# Patient Record
Sex: Male | Born: 1995 | Race: Black or African American | Hispanic: No | Marital: Single
Health system: Southern US, Community
[De-identification: ages and names within clinical notes are randomized; demographics above are authoritative.]

## PROBLEM LIST (undated history)

## (undated) DIAGNOSIS — A64 Unspecified sexually transmitted disease: Secondary | ICD-10-CM

## (undated) DIAGNOSIS — J9819 Other pulmonary collapse: Secondary | ICD-10-CM

## (undated) HISTORY — PX: CHEST TUBE INSERTION: SHX231

---
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
## 2014-01-03 ENCOUNTER — Ambulatory Visit: Payer: Medicaid Other | Admitting: Family Medicine

## 2014-05-13 ENCOUNTER — Encounter (HOSPITAL_COMMUNITY): Payer: Self-pay | Admitting: Emergency Medicine

## 2014-05-13 ENCOUNTER — Emergency Department (HOSPITAL_COMMUNITY)
Admission: EM | Admit: 2014-05-13 | Discharge: 2014-05-13 | Disposition: A | Discharge status: Home / Self Care | Payer: Medicaid Other | Attending: Emergency Medicine | Admitting: Emergency Medicine

## 2014-05-13 DIAGNOSIS — J029 Acute pharyngitis, unspecified: Secondary | ICD-10-CM | POA: Diagnosis present

## 2014-05-13 DIAGNOSIS — Z720 Tobacco use: Secondary | ICD-10-CM | POA: Insufficient documentation

## 2014-05-13 DIAGNOSIS — R509 Fever, unspecified: Secondary | ICD-10-CM

## 2014-05-13 LAB — RAPID STREP SCREEN (MED CTR MEBANE ONLY): Streptococcus, Group A Screen (Direct): NEGATIVE

## 2014-05-13 MED ORDER — ACETAMINOPHEN 325 MG PO TABS
650.0000 mg | ORAL_TABLET | Freq: Four times a day (QID) | ORAL | Status: DC | PRN
  Administered 2014-05-13: 650 mg via ORAL
  Filled 2014-05-13: qty 2

## 2014-05-13 MED ORDER — IBUPROFEN 800 MG PO TABS: 800.0000 mg | ORAL_TABLET | Freq: Four times a day (QID) | ORAL | Status: DC | PRN

## 2014-05-13 NOTE — Discharge Instructions (Signed)
Fever, Adult °A fever is a higher than normal body temperature. In an adult, an oral temperature around 98.6° F (37° C) is considered normal. A temperature of 100.4° F (38° C) or higher is generally considered a fever. Mild or moderate fevers generally have no long-term effects and often do not require treatment. Extreme fever (greater than or equal to 106° F or 41.1° C) can cause seizures. The sweating that may occur with repeated or prolonged fever may cause dehydration. Elderly people can develop confusion during a fever. °A measured temperature can vary with: °· Age. °· Time of day. °· Method of measurement (mouth, underarm, rectal, or ear). °The fever is confirmed by taking a temperature with a thermometer. Temperatures can be taken different ways. Some methods are accurate and some are not. °· An oral temperature is used most commonly. Electronic thermometers are fast and accurate. °· An ear temperature will only be accurate if the thermometer is positioned as recommended by the manufacturer. °· A rectal temperature is accurate and done for those adults who have a condition where an oral temperature cannot be taken. °· An underarm (axillary) temperature is not accurate and not recommended. °Fever is a symptom, not a disease.  °CAUSES  °· Infections commonly cause fever. °· Some noninfectious causes for fever include: °¨ Some arthritis conditions. °¨ Some thyroid or adrenal gland conditions. °¨ Some immune system conditions. °¨ Some types of cancer. °¨ A medicine reaction. °¨ High doses of certain street drugs such as methamphetamine. °¨ Dehydration. °¨ Exposure to high outside or room temperatures. °· Occasionally, the source of a fever cannot be determined. This is sometimes called a "fever of unknown origin" (FUO). °· Some situations may lead to a temporary rise in body temperature that may go away on its own. Examples are: °¨ Childbirth. °¨ Surgery. °¨ Intense exercise. °HOME CARE INSTRUCTIONS  °· Take  appropriate medicines for fever. Follow dosing instructions carefully. If you use acetaminophen to reduce the fever, be careful to avoid taking other medicines that also contain acetaminophen. Do not take aspirin for a fever if you are younger than age 19. There is an association with Reye's syndrome. Reye's syndrome is a rare but potentially deadly disease. °· If an infection is present and antibiotics have been prescribed, take them as directed. Finish them even if you start to feel better. °· Rest as needed. °· Maintain an adequate fluid intake. To prevent dehydration during an illness with prolonged or recurrent fever, you may need to drink extra fluid. Drink enough fluids to keep your urine clear or pale yellow. °· Sponging or bathing with room temperature water may help reduce body temperature. Do not use ice water or alcohol sponge baths. °· Dress comfortably, but do not over-bundle. °SEEK MEDICAL CARE IF:  °· You are unable to keep fluids down. °· You develop vomiting or diarrhea. °· You are not feeling at least partly better after 3 days. °· You develop new symptoms or problems. °SEEK IMMEDIATE MEDICAL CARE IF:  °· You have shortness of breath or trouble breathing. °· You develop excessive weakness. °· You are dizzy or you faint. °· You are extremely thirsty or you are making little or no urine. °· You develop new pain that was not there before (such as in the head, neck, chest, back, or abdomen). °· You have persistent vomiting and diarrhea for more than 1 to 2 days. °· You develop a stiff neck or your eyes become sensitive to light. °· You develop a   skin rash.  You have a fever or persistent symptoms for more than 2 to 3 days.  You have a fever and your symptoms suddenly get worse. MAKE SURE YOU:   Understand these instructions.  Will watch your condition.  Will get help right away if you are not doing well or get worse. Document Released: 08/11/2000 Document Revised: 07/02/2013 Document  Reviewed: 12/17/2010 [facility] Patient Information 2015 [place]. This information is not intended to replace advice given to you by your health care provider. Make sure you discuss any questions you have with your health care provider. Your strep test is negative.  Can take alternating doses of Tylenol and ibuprofen every 3-4 hours to control fever.  Make sure you get plenty of rest, drink plenty of fluids.  Return if he develops new or worsening symptoms

## 2014-05-13 NOTE — ED Notes (Signed)
Pt states he has had a sore throat since he woke up today. Febrile. Alert and oriented.

## 2014-05-13 NOTE — ED Provider Notes (Signed)
CSN: [id number]     Arrival date & time 05/13/14  1946 History  This chart was scribed for Daniel Higgins Daniel Creed, NP working with Linwood DibblesJon Knapp, MD by Daniel Risingimelie Higgins, ED Scribe. This patient was seen in room WTR8/WTR8 and Daniel patient's care was started at 8:49 PM.   Chief Complaint  Patient presents with  . Sore Throat   Daniel history is provided by Daniel patient. No language interpreter was used.   HPI Comments: [name] is a 19 y.o. male who presents to [facility] Emergency Department complaining of sore throat and fever since awakening this morning. Patient denies additional complaints. Patient has not taken any medication. Patient denies recent sick contacts.   History reviewed. No pertinent past medical history. History reviewed. No pertinent past surgical history. History reviewed. No pertinent family history. History  Substance Use Topics  . Smoking status: Current Some Day Smoker  . Smokeless tobacco: Not on file  . Alcohol Use: No    Review of Systems  Constitutional: Positive for fever.  HENT: Positive for sore throat. Negative for rhinorrhea and trouble swallowing.   Respiratory: Negative for cough and shortness of breath.   Gastrointestinal: Negative for abdominal pain.  Musculoskeletal: Negative for myalgias.      Allergies  Review of patient's allergies indicates no known allergies.  Home Medications   Prior to Admission medications   Medication Sig Start Date End Date Taking? Authorizing Provider  ibuprofen (ADVIL,MOTRIN) 800 MG tablet Take 1 tablet (800 mg total) by mouth every 6 (six) hours as needed. 05/13/14   Daniel Higgins Toniann Dickerson, NP   Triage Vitals: BP 102/75 mmHg  Pulse 117  Temp(Src) 103.1 F (39.5 C) (Oral)  SpO2 97% Physical Exam  Constitutional: He is oriented to person, place, and time. He appears well-developed and well-nourished. No distress.  Patient appears ill  HENT:  Head: Normocephalic and atraumatic.  Right Ear: External ear normal.  Left Ear: External ear  normal.  Eyes: Pupils are equal, round, and reactive to light.  Neck: Normal range of motion.  Cardiovascular: Normal rate and regular rhythm.   Pulmonary/Chest: Effort normal and breath sounds normal.  Abdominal: Soft. He exhibits no distension. There is no tenderness.  Musculoskeletal: Normal range of motion.  Neurological: He is alert and oriented to person, place, and time.  Skin: Skin is warm. No rash noted. No erythema.  Nursing note and vitals reviewed.   ED Course  Procedures (including critical care time)  COORDINATION OF CARE: 9:22 PM- Discussed treatment plan with patient at bedside and patient agreed to plan.   Labs Review Labs Reviewed  RAPID STREP SCREEN  CULTURE, GROUP A STREP    Imaging Review No results found.   EKG Interpretation None     Patient's strep test is negative.  He's been instructed to take alternating doses of Tylenol or ibuprofen for any fever over 100.5, reported fluid, get plenty of rest.  Return if he develops new or worsening symptoms MDM   Final diagnoses:  Pharyngitis  Fever, unspecified fever cause       I personally performed Daniel services described in this documentation, which was scribed in my presence. Daniel recorded information has been reviewed and is accurate.   Daniel Higgins Kassia Demarinis, NP 05/13/14 [id number]  Eber HongBrian Miller, MD 05/14/14 1146

## 2014-05-16 LAB — CULTURE, GROUP A STREP: Strep A Culture: POSITIVE — AB

## 2014-05-17 ENCOUNTER — Telehealth (HOSPITAL_COMMUNITY): Payer: Self-pay

## 2014-05-17 ENCOUNTER — Telehealth (HOSPITAL_BASED_OUTPATIENT_CLINIC_OR_DEPARTMENT_OTHER): Payer: Self-pay | Admitting: Emergency Medicine

## 2014-05-17 NOTE — Progress Notes (Signed)
ED Antimicrobial Stewardship Positive Culture Follow Up   Daniel MentionShamar R Shawnie Higgins is an 19 y.o. male [facility] with a chief complaint of  Chief Complaint  Patient presents with  . Sore Throat    Recent Results (from the past 720 hour(s))  Rapid strep screen   (If patient has fever and/or without cough or runny nose)     Status: None   Collection Time: 05/13/14  7:37 PM  Result Value Ref Range Status   Streptococcus, Group A Screen (Direct) NEGATIVE NEGATIVE Final    Comment: (NOTE) A Rapid Antigen test may result negative if the antigen level in the sample is below the detection level of this test. The FDA has not cleared this test as a stand-alone test therefore the rapid antigen negative result has reflexed to a Group A Strep culture.   Culture, Group A Strep     Status: Abnormal   Collection Time: 05/13/14  7:37 PM  Result Value Ref Range Status   Strep A Culture Positive (A)  Final    Comment: (NOTE) Penicillin and ampicillin are drugs of choice for treatment of beta-hemolytic streptococcal infections. Susceptibility testing of penicillins and other beta-lactam agents approved by the FDA for treatment of beta-hemolytic streptococcal infections need not be performed routinely because nonsusceptible isolates are extremely rare in any beta-hemolytic streptococcus and have not been reported for Streptococcus pyogenes (group A). (CLSI 2011) Performed At: [facility] [address] [place], [place] [id number] Mila HomerHancock William F MD VV:[id number]:[phone]     []  Treated with , organism resistant to prescribed antimicrobial [x]  Patient discharged originally without antimicrobial agent and treatment is now indicated   19 yo who presented with sore throat. Now positive for strep.  New antibiotic prescription:   Amoxicillin 500mg  PO BID x10days  ED Provider: Teressa LowerVrinda Pickering, NP  Ulyses SouthwardMinh Elaijah Munoz, PharmD Pager: [phone] Infectious Diseases  Pharmacist Phone# [phone]

## 2014-05-17 NOTE — Telephone Encounter (Signed)
Pt called back w/name of Pharmacy.  Rx called in and given to RPh @ [facility] [phone].

## 2014-12-01 ENCOUNTER — Emergency Department (HOSPITAL_COMMUNITY): Payer: Medicaid Other

## 2014-12-01 ENCOUNTER — Emergency Department (HOSPITAL_COMMUNITY)
Admission: EM | Admit: 2014-12-01 | Discharge: 2014-12-02 | Disposition: A | Discharge status: Home / Self Care | Payer: Medicaid Other | Attending: Emergency Medicine | Admitting: Emergency Medicine

## 2014-12-01 ENCOUNTER — Encounter (HOSPITAL_COMMUNITY): Payer: Self-pay | Admitting: Emergency Medicine

## 2014-12-01 DIAGNOSIS — R079 Chest pain, unspecified: Secondary | ICD-10-CM | POA: Diagnosis not present

## 2014-12-01 DIAGNOSIS — R059 Cough, unspecified: Secondary | ICD-10-CM

## 2014-12-01 DIAGNOSIS — J029 Acute pharyngitis, unspecified: Secondary | ICD-10-CM | POA: Diagnosis not present

## 2014-12-01 DIAGNOSIS — R0981 Nasal congestion: Secondary | ICD-10-CM | POA: Insufficient documentation

## 2014-12-01 DIAGNOSIS — R05 Cough: Secondary | ICD-10-CM | POA: Diagnosis not present

## 2014-12-01 DIAGNOSIS — Z720 Tobacco use: Secondary | ICD-10-CM | POA: Insufficient documentation

## 2014-12-01 MED ORDER — BENZONATATE 100 MG PO CAPS
200.0000 mg | ORAL_CAPSULE | Freq: Once | ORAL | Status: AC
  Administered 2014-12-02: 200 mg via ORAL
  Filled 2014-12-01: qty 2

## 2014-12-01 MED ORDER — KETOROLAC TROMETHAMINE 60 MG/2ML IM SOLN
60.0000 mg | Freq: Once | INTRAMUSCULAR | Status: AC
  Administered 2014-12-02: 60 mg via INTRAMUSCULAR
  Filled 2014-12-01: qty 2

## 2014-12-01 NOTE — ED Provider Notes (Signed)
CSN: [id number]   Arrival date & time 12/01/14 2227  History  By signing my name below, I, Bethel Born, attest that this documentation has been prepared under the direction and in the presence of Skyylar Kopf, MD. Electronically Signed: Bethel Born, ED Scribe. 12/02/2014. 12:06 AM.  No chief complaint on file.   HPI Patient is a 19 y.o. male presenting with chest pain. The history is provided by the patient. No language interpreter was used.  Chest Pain Chest pain location: Diffuse. Pain radiates to:  Does not radiate Pain radiates to the back: no   Pain severity:  Moderate Onset quality:  Gradual Duration:  1 week Timing:  Sporadic Progression:  Unchanged Chronicity:  New Context comment:  Coughing Relieved by:  Nothing Worsened by:  Coughing Ineffective treatments: OTC medication. Associated symptoms: cough   Associated symptoms: no abdominal pain, no anorexia, no back pain, no diaphoresis, no dizziness, no fever, no headache, no lower extremity edema, no nausea, no numbness, no palpitations, no shortness of breath, not vomiting and no weakness   Cough:    Cough characteristics:  Non-productive   Severity:  Moderate   Onset quality:  Gradual   Timing:  Sporadic   Progression:  Unchanged   Chronicity:  New Risk factors: smoking   Risk factors: no diabetes mellitus    Daniel Higgins is a 19 y.o. male who presents to the Emergency Department complaining of constant and diffuse chest pain elicited with a dry cough with onset 1 week ago. OTC cold medication provided insufficient relief in symptoms PTA.  Associated symptoms include sore throat. Pt denies fever, abdominal pain,vomiting, and diarrhea. Tobacco+.   History reviewed. No pertinent past medical history.  History reviewed. No pertinent past surgical history.  History reviewed. No pertinent family history.  Social History  Substance Use Topics  . Smoking status: Current Some Day Smoker  . Smokeless tobacco:  None  . Alcohol Use: No     Review of Systems  Constitutional: Negative for fever and diaphoresis.  HENT: Positive for congestion and sore throat.   Respiratory: Positive for cough. Negative for chest tightness and shortness of breath.   Cardiovascular: Positive for chest pain. Negative for palpitations and leg swelling.  Gastrointestinal: Negative for nausea, vomiting, abdominal pain and anorexia.  Musculoskeletal: Negative for back pain.  Neurological: Negative for dizziness, weakness, numbness and headaches.  All other systems reviewed and are negative.  Home Medications   Prior to Admission medications   Medication Sig Start Date End Date Taking? Authorizing Provider  ibuprofen (ADVIL,MOTRIN) 800 MG tablet Take 1 tablet (800 mg total) by mouth every 6 (six) hours as needed. 05/13/14   Earley Favor, NP    Allergies  Review of patient's allergies indicates no known allergies.  Triage Vitals: BP 106/57 mmHg  Pulse 94  Temp(Src) 98.3 F (36.8 C) (Oral)  Resp 18  SpO2 99%  Physical Exam  Constitutional: He is oriented to person, place, and time. He appears well-developed and well-nourished. No distress.  HENT:  Head: Normocephalic and atraumatic.  Mouth/Throat: Oropharynx is clear and moist. No oropharyngeal exudate.  Moist mucous membranes  Eyes: Conjunctivae and EOM are normal. Pupils are equal, round, and reactive to light.  Neck: Normal range of motion. Neck supple. No tracheal deviation present.  Trachea midline  Cardiovascular: Normal rate and regular rhythm.  Exam reveals no gallop and no friction rub.   No murmur heard. Pulmonary/Chest: Effort normal and breath sounds normal. No stridor. No respiratory distress.  He has no wheezes. He has no rales. He exhibits no tenderness.  Abdominal: Soft. Bowel sounds are normal. There is no tenderness. There is no rebound and no guarding.  Musculoskeletal: Normal range of motion. He exhibits no edema or tenderness.  Neurological:  He is alert and oriented to person, place, and time.  Skin: Skin is warm and dry.  Psychiatric: He has a normal mood and affect. His behavior is normal.  Nursing note and vitals reviewed.   ED Course  Procedures   DIAGNOSTIC STUDIES: Oxygen Saturation is 99% on RA, normal by my interpretation.    COORDINATION OF CARE: 11:27 PM Discussed treatment plan which includes CXR, EKG, and pain management with pt at bedside and pt agreed to plan.  Labs Reviewed - No data to display  Imaging Review No results found.   I personally reviewed and evaluated these images and lab results as a part of my medical decision-making.   EKG Interpretation  Date/Time:  Sunday December 01 2014 23:35:06 EDT Ventricular Rate:  84 PR Interval:  148 QRS Duration: 82 QT Interval:  333 QTC Calculation: 394 R Axis:   84 Text Interpretation:  Sinus rhythm Confirmed by [facility]  MD, Yamilee Harmes ([id number]) on 12/01/2014 11:39:58 PM    MDM   Final diagnoses:  None  No coughing in the ED. PERC negative wells 0 highly doubt PE.  Likely allergic.  Will start zyrtec follow up with your PMD   I, Kinisha Soper, MD, personally performed the services described in this documentation. All medical record entries made by the scribe were at my direction and in my presence.  I have reviewed the chart and discharge instructions and agree that the record reflects my personal performance and is accurate and complete. Arlow Spiers, MD.  12/02/2014. 12:10 AM.      Jezabelle Chisolm, MD 12/02/14 5621

## 2014-12-01 NOTE — ED Notes (Signed)
Pt here from home. States "when he coughs his heart and ribs hurt". Pt states he has had a cough x 1 week. Pt is a smoker

## 2014-12-02 ENCOUNTER — Encounter (HOSPITAL_COMMUNITY): Payer: Self-pay | Admitting: Emergency Medicine

## 2014-12-02 MED ORDER — CETIRIZINE HCL 10 MG PO CAPS: 1.0000 | ORAL_CAPSULE | Freq: Every morning | ORAL | Status: DC

## 2014-12-02 NOTE — Discharge Instructions (Signed)
Cool Mist Vaporizers °Vaporizers may help relieve the symptoms of a cough and cold. They add moisture to the air, which helps mucus to become thinner and less sticky. This makes it easier to breathe and cough up secretions. Cool mist vaporizers do not cause serious burns like hot mist vaporizers, which may also be called steamers or humidifiers. Vaporizers have not been proven to help with colds. You should not use a vaporizer if you are allergic to mold. °HOME CARE INSTRUCTIONS °· Follow the package instructions for the vaporizer. °· Do not use anything other than distilled water in the vaporizer. °· Do not run the vaporizer all of the time. This can cause mold or bacteria to grow in the vaporizer. °· Clean the vaporizer after each time it is used. °· Clean and dry the vaporizer well before storing it. °· Stop using the vaporizer if worsening respiratory symptoms develop. °Document Released: 11/13/2003 Document Revised: 02/20/2013 Document Reviewed: 07/05/2012 °ExitCare® Patient Information ©2015 [employer]. This information is not intended to replace advice given to you by your health care provider. Make sure you discuss any questions you have with your health care provider. ° °

## 2014-12-17 ENCOUNTER — Encounter (HOSPITAL_COMMUNITY): Payer: Self-pay | Admitting: Emergency Medicine

## 2014-12-17 ENCOUNTER — Emergency Department (HOSPITAL_COMMUNITY)
Admission: EM | Admit: 2014-12-17 | Discharge: 2014-12-17 | Disposition: A | Discharge status: Home / Self Care | Payer: Medicaid Other | Attending: Emergency Medicine | Admitting: Emergency Medicine

## 2014-12-17 DIAGNOSIS — Z720 Tobacco use: Secondary | ICD-10-CM | POA: Diagnosis not present

## 2014-12-17 DIAGNOSIS — Z202 Contact with and (suspected) exposure to infections with a predominantly sexual mode of transmission: Secondary | ICD-10-CM | POA: Insufficient documentation

## 2014-12-17 DIAGNOSIS — Z113 Encounter for screening for infections with a predominantly sexual mode of transmission: Secondary | ICD-10-CM

## 2014-12-17 MED ORDER — AZITHROMYCIN 250 MG PO TABS
1000.0000 mg | ORAL_TABLET | Freq: Once | ORAL | Status: DC
  Filled 2014-12-17: qty 4

## 2014-12-17 MED ORDER — CEFTRIAXONE SODIUM 250 MG IJ SOLR: 250.0000 mg | Freq: Once | INTRAMUSCULAR | Status: DC

## 2014-12-17 NOTE — ED Provider Notes (Signed)
CSN: [id number]     Arrival date & time 12/17/14  1742 History  By signing my name below, I, Freida Busman, attest that this documentation has been prepared under the direction and in the presence of non-physician practitioner, Arthor Captain, PA-C. Electronically Signed: Freida Busman, Scribe. 12/17/2014. 6:03 PM.   Chief Complaint  Patient presents with  . Exposure to STD   The history is provided by the patient. No language interpreter was used.    HPI Comments:  Daniel Higgins is a 19 y.o. male who presents to the Emergency Department for STD testing. He denies known STD exposure; states he just wanted to get checked out. Pt is asymptomatic at this time; no fever, penile discharge/drainage,rash or dysuria.  History reviewed. No pertinent past medical history. History reviewed. No pertinent past surgical history. No family history on file. Social History  Substance Use Topics  . Smoking status: Current Some Day Smoker    Types: Cigars  . Smokeless tobacco: None  . Alcohol Use: No    Review of Systems  Constitutional: Negative for fever and chills.  Respiratory: Negative for shortness of breath.   Cardiovascular: Negative for chest pain.  Genitourinary: Negative for dysuria, discharge, penile swelling, scrotal swelling, genital sores, penile pain and testicular pain.  Skin: Negative for rash.   Allergies  Review of patient's allergies indicates no known allergies.  Home Medications   Prior to Admission medications   Medication Sig Start Date End Date Taking? Authorizing Provider  Cetirizine HCl (ZYRTEC ALLERGY) 10 MG CAPS Take 1 capsule (10 mg total) by mouth every morning. 12/02/14   April Palumbo, MD  ibuprofen (ADVIL,MOTRIN) 800 MG tablet Take 1 tablet (800 mg total) by mouth every 6 (six) hours as needed. Patient not taking: Reported on 12/01/2014 05/13/14   Earley Favor, NP   BP 110/68 mmHg  Pulse 99  Temp(Src) 98.4 F (36.9 C) (Oral)  Resp 16  Ht 6' (1.829 m)   SpO2 98% Physical Exam  Constitutional: He is oriented to person, place, and time. He appears well-developed and well-nourished. No distress.  HENT:  Head: Normocephalic and atraumatic.  Eyes: Conjunctivae are normal.  Cardiovascular: Normal rate.   Pulmonary/Chest: Effort normal.  Abdominal: He exhibits no distension.  Genitourinary:  Chaperone (ED Tech) was present for exam which was performed with no discomfort or complications.    Neurological: He is alert and oriented to person, place, and time.  Skin: Skin is warm and dry.  Psychiatric: He has a normal mood and affect.  Nursing note and vitals reviewed.   ED Course  Procedures   DIAGNOSTIC STUDIES:  Oxygen Saturation is 98% on RA, normal by my interpretation.    COORDINATION OF CARE:  6:10 PM Will order lab tests. Discussed treatment plan with pt at bedside and pt agreed to plan.  Labs Review Labs Reviewed  HIV ANTIBODY (ROUTINE TESTING)  RPR  GC/CHLAMYDIA PROBE AMP ([place]) NOT AT [facility]    Imaging Review No results found. I have personally reviewed and evaluated these lab results as part of my medical decision-making.   EKG Interpretation None      MDM   Final diagnoses:  None    Patient without sxs or exposure. Cultures/hiv/rpr taken  Patient discharged with f/u instructions. Referral to health department for routine STD testinng \  I, Arthor Captain, personally performed the services described in this documentation. All medical record entries made by the scribe were at my direction and in my presence.  I have reviewed the chart and discharge instructions and agree that the record reflects my personal performance and is accurate and complete. Arthor CaptainHarris, Alontae Chaloux.  12/17/2014. 8:07 PM.       Arthor CaptainAbigail Tashon Capp, PA-C 12/17/14 2008  Donnetta HutchingBrian Cook, MD 12/17/14 2239

## 2014-12-17 NOTE — Discharge Instructions (Signed)
Sexually Transmitted Disease °A sexually transmitted disease (STD) is a disease or infection that may be passed (transmitted) from person to person, usually during sexual activity. This may happen by way of saliva, semen, blood, vaginal mucus, or urine. Common STDs include: °· Gonorrhea. °· Chlamydia. °· Syphilis. °· HIV and AIDS. °· Genital herpes. °· Hepatitis B and C. °· Trichomonas. °· Human papillomavirus (HPV). °· Pubic lice. °· Scabies. °· Mites. °· Bacterial vaginosis. °WHAT ARE CAUSES OF STDs? °An STD may be caused by bacteria, a virus, or parasites. STDs are often transmitted during sexual activity if one person is infected. However, they may also be transmitted through nonsexual means. STDs may be transmitted after:  °· Sexual intercourse with an infected person. °· Sharing sex toys with an infected person. °· Sharing needles with an infected person or using unclean piercing or tattoo needles. °· Having intimate contact with the genitals, mouth, or rectal areas of an infected person. °· Exposure to infected fluids during birth. °WHAT ARE THE SIGNS AND SYMPTOMS OF STDs? °Different STDs have different symptoms. Some people may not have any symptoms. If symptoms are present, they may include: °· Painful or bloody urination. °· Pain in the pelvis, abdomen, vagina, anus, throat, or eyes. °· A skin rash, itching, or irritation. °· Growths, ulcerations, blisters, or sores in the genital and anal areas. °· Abnormal vaginal discharge with or without bad odor. °· Penile discharge in men. °· Fever. °· Pain or bleeding during sexual intercourse. °· Swollen glands in the groin area. °· Yellow skin and eyes (jaundice). This is seen with hepatitis. °· Swollen testicles. °· Infertility. °· Sores and blisters in the mouth. °HOW ARE STDs DIAGNOSED? °To make a diagnosis, your health care provider may: °· Take a medical history. °· Perform a physical exam. °· Take a sample of any discharge to examine. °· Swab the throat,  cervix, opening to the penis, rectum, or vagina for testing. °· Test a sample of your first morning urine. °· Perform blood tests. °· Perform a Pap test, if this applies. °· Perform a colposcopy. °· Perform a laparoscopy. °HOW ARE STDs TREATED? °Treatment depends on the STD. Some STDs may be treated but not cured. °· Chlamydia, gonorrhea, trichomonas, and syphilis can be cured with antibiotic medicine. °· Genital herpes, hepatitis, and HIV can be treated, but not cured, with prescribed medicines. The medicines lessen symptoms. °· Genital warts from HPV can be treated with medicine or by freezing, burning (electrocautery), or surgery. Warts may come back. °· HPV cannot be cured with medicine or surgery. However, abnormal areas may be removed from the cervix, vagina, or vulva. °· If your diagnosis is confirmed, your recent sexual partners need treatment. This is true even if they are symptom-free or have a negative culture or evaluation. They should not have sex until their health care providers say it is okay. °· Your health care provider may test you for infection again 3 months after treatment. °HOW CAN I REDUCE MY RISK OF GETTING AN STD? °Take these steps to reduce your risk of getting an STD: °· Use latex condoms, dental dams, and water-soluble lubricants during sexual activity. Do not use petroleum jelly or oils. °· Avoid having multiple sex partners. °· Do not have sex with someone who has other sex partners °· Do not have sex with anyone you do not know or who is at high risk for an STD. °· Avoid risky sex practices that can break your skin. °· Do not have sex   sex if you have open sores on your mouth or skin.  Avoid drinking too much alcohol or taking illegal drugs. Alcohol and drugs can affect your judgment and put you in a vulnerable position.  Avoid engaging in oral and anal sex acts.  Get vaccinated for HPV and hepatitis. If you have not received these vaccines in the past, talk to your health care  provider about whether one or both might be right for you.  If you are at risk of being infected with HIV, it is recommended that you take a prescription medicine daily to prevent HIV infection. This is called pre-exposure prophylaxis (PrEP). You are considered at risk if:  You are a man who has sex with other men (MSM).  You are a heterosexual man or woman and are sexually active with more than one partner.  You take drugs by injection.  You are sexually active with a partner who has HIV.  Talk with your health care provider about whether you are at high risk of being infected with HIV. If you choose to begin PrEP, you should first be tested for HIV. You should then be tested every 3 months for as long as you are taking PrEP. WHAT SHOULD I DO IF I THINK I HAVE AN STD?  See your health care provider.  Tell your sexual partner(s). They should be tested and treated for any STDs.  Do not have sex until your health care provider says it is okay. WHEN SHOULD I GET IMMEDIATE MEDICAL CARE? Contact your health care provider right away if:   You have severe abdominal pain.  You are a man and notice swelling or pain in your testicles.  You are a woman and notice swelling or pain in your vagina.   This information is not intended to replace advice given to you by your health care provider. Make sure you discuss any questions you have with your health care provider.   Document Released: 05/08/2002 Document Revised: 03/08/2014 Document Reviewed: 09/05/2012 Elsevier Interactive Patient Education 2016 Elsevier Inc.  Free HIV and STD Testing These locations offer FREE confidential testing for HIV, Chlamydia, Gonorrhea, and Syphilis. Non-Traditional Testing Sites Address Telephone  [facility] [address], [place] [phone] Mondays 5pm - 7pm  [facility] [address] [place] [phone] Wednesdays  2pm-8pm  [facility] [address], [place] [phone] Thursdays 9am-12noon 1pm-4pm  [facility] [address], [place] [phone] Tuesdays Thursdays 9am-12noon 1pm-4pm  [facility] offers free, confidential testing and treatment for HIV, Chlamydia, Gonorrhea, Syphilis, Herpes, Bacterial Vaginosis, Yeast, and Trichomoniasis. Traditional Testing   [facility] [address], [place] [phone]  Monday thru Friday  Call for an appointment  [facility] [address] [place] [phone] Monday thru Friday  Call for anappointment.  If you have any questions about this information please call [phone]. 01/07/2011

## 2014-12-17 NOTE — ED Notes (Signed)
Pt from home for std check, denies any symptoms at this time, states "my mamma told me to come get checked out." pt denies any pain.

## 2014-12-18 LAB — RPR: RPR Ser Ql: NONREACTIVE

## 2014-12-18 LAB — GC/CHLAMYDIA PROBE AMP (~~LOC~~) NOT AT ARMC
Chlamydia: NEGATIVE
NEISSERIA GONORRHEA: NEGATIVE

## 2014-12-18 LAB — HIV ANTIBODY (ROUTINE TESTING W REFLEX): HIV SCREEN 4TH GENERATION: NONREACTIVE

## 2015-08-13 ENCOUNTER — Emergency Department
Admission: EM | Admit: 2015-08-13 | Discharge: 2015-08-13 | Disposition: A | Discharge status: Home / Self Care | Payer: Self-pay | Attending: Emergency Medicine | Admitting: Emergency Medicine

## 2015-08-13 ENCOUNTER — Emergency Department: Payer: Self-pay

## 2015-08-13 ENCOUNTER — Encounter: Payer: Self-pay | Admitting: *Deleted

## 2015-08-13 DIAGNOSIS — T50905A Adverse effect of unspecified drugs, medicaments and biological substances, initial encounter: Secondary | ICD-10-CM

## 2015-08-13 DIAGNOSIS — R079 Chest pain, unspecified: Secondary | ICD-10-CM | POA: Insufficient documentation

## 2015-08-13 DIAGNOSIS — T380X5A Adverse effect of glucocorticoids and synthetic analogues, initial encounter: Secondary | ICD-10-CM | POA: Insufficient documentation

## 2015-08-13 DIAGNOSIS — Z79899 Other long term (current) drug therapy: Secondary | ICD-10-CM | POA: Insufficient documentation

## 2015-08-13 DIAGNOSIS — F17210 Nicotine dependence, cigarettes, uncomplicated: Secondary | ICD-10-CM | POA: Insufficient documentation

## 2015-08-13 LAB — BASIC METABOLIC PANEL
Anion gap: 8 (ref 5–15)
BUN: 7 mg/dL (ref 6–20)
CHLORIDE: 103 mmol/L (ref 101–111)
CO2: 28 mmol/L (ref 22–32)
Calcium: 9.5 mg/dL (ref 8.9–10.3)
Creatinine, Ser: 0.72 mg/dL (ref 0.61–1.24)
GFR calc non Af Amer: >60 mL/min (ref >60)
Glucose, Bld: 87 mg/dL (ref 65–99)
POTASSIUM: 3.5 mmol/L (ref 3.5–5.1)
SODIUM: 139 mmol/L (ref 135–145)

## 2015-08-13 LAB — CBC
HEMATOCRIT: 41.3 % (ref 40.0–52.0)
Hemoglobin: 13.6 g/dL (ref 13.0–18.0)
MCH: 28.1 pg (ref 26.0–34.0)
MCHC: 32.9 g/dL (ref 32.0–36.0)
MCV: 85.2 fL (ref 80.0–100.0)
Platelets: 182 10*3/uL (ref 150–440)
RBC: 4.85 MIL/uL (ref 4.40–5.90)
RDW: 13.9 % (ref 11.5–14.5)
WBC: 7.8 10*3/uL (ref 3.8–10.6)

## 2015-08-13 LAB — TROPONIN I: Troponin I: <0.03 ng/mL (ref <0.031)

## 2015-08-13 NOTE — ED Notes (Signed)
Pt reports he had a headache yesterday and took a pill called d.bal.max a steroid.   Pt started having chest pain and dizziness since taking it. Pt continues to have a headache.  Pt alert.  Speech clear.

## 2015-08-13 NOTE — Discharge Instructions (Signed)
Drug Toxicity °Drug toxicity refers to harmful and unwanted (adverse) effects of a drug in your body. Drug toxicity often results from taking too much of a drug (overdose) by accident or on purpose. With some drugs, there is only a small difference between the dose that is needed to treat your condition and a dose that is harmful (narrow therapeutic range). However, any drug can be toxic at high doses, and even normal doses of certain drugs can be toxic for some people. These include over-the-counter (OTC) medicines. °Drug toxicity can happen suddenly when you first start taking a drug or when you suddenly take too much of a drug (acute toxicity). It can also happen as a result of taking a drug for a long period of time (chronic toxicity). The effects of drug toxicity can be mild, dangerous, or even deadly. °CAUSES °Many things can cause drug toxicity. Common causes of acute toxicity include a drug overdose or an allergic reaction to a drug. °Most drugs are broken down (metabolized) by your liver and eliminated (excreted) by your kidneys. Chronic drug toxicity can result from changes in the way that your body metabolizes a drug. This can happen, for example, if you weigh less than you did when you started taking a drug but you keep taking the same dose that you took at the heavier weight. °RISK FACTORS °You may have a higher risk for drug toxicity if you: °· Are under 18 years of age or over 65 years of age. °· Have liver disease, kidney disease, or another medical condition. °· Are taking more than one drug. °· Are pregnant. °· Are allergic to certain drugs. °· Have genes that cause you to be more affected by (susceptible to) certain drugs. °· Take a drug that has a narrow therapeutic range. °Certain types of drugs are more likely than others to cause toxicity. Many drugs have a narrow therapeutic range, including: °· Blood thinners. °· Heart medicines. °· Diabetes medicines. °· Medicines to prevent or stop  seizures. °· Theophylline for asthma. °· Lithium for bipolar disorder. °SYMPTOMS °Signs and symptoms of drug toxicity depend on the drug and the amount that was taken. They may start suddenly or develop gradually over time. °DIAGNOSIS °Drug toxicity may be diagnosed based on your symptoms. Some drugs have known side effects that suggest toxicity. It is important that you tell health care provider about all of the drugs that you are taking and whether you have ever had a reaction to a drug. °Your health care provider will do a physical exam. You may have tests to check for drug toxicity, including: °· Blood tests to measure the amount of the drug in your blood or to check for signs of kidney or liver damage. °· Urine tests. °· Other tests to check for organ damage. °TREATMENT °Treatment may include: °· Stopping the drug. °· Lowering the dose of the drug. °· Switching to a different drug. °You may also need treatment to stop or reverse the effects of the toxicity. These treatments depend on the drug that caused the toxicity, how severe the toxicity is, and which parts of your body are affected. °HOME CARE INSTRUCTIONS °· Take medicines only as directed by your health care provider. Always ask your health care provider to discuss the possible side effects of any new drug that you start taking. °· Keep a list of all of the drugs that you take, including over-the-counter medicines. Bring this list with you to all of your medical visits. °· Read   the drug inserts that come with your medicines. °· Keep all follow-up visits as directed by your health care provider. This is important. °SEEK MEDICAL CARE IF: °· Your symptoms return. °· You develop any new signs or symptoms when you are taking medicines. °· You notice any signs that indicate that you are taking too much of your medicine, based on what your health care provider told you to watch for. °SEEK IMMEDIATE MEDICAL CARE IF: °· You have chest pain. °· You have difficulty  breathing. °· You have a loss of consciousness. °  °This information is not intended to replace advice given to you by your health care provider. Make sure you discuss any questions you have with your health care provider. °  °Document Released: 02/15/2005 Document Revised: 07/02/2014 Document Reviewed: 02/20/2014 °Elsevier Interactive Patient Education ©2016 Elsevier Inc. ° °

## 2015-08-13 NOTE — ED Provider Notes (Signed)
[facility] Emergency Department Provider Note  ____________________________________________  Time seen: Approximately 3:37 PM  I have reviewed the triage vital signs and the nursing notes.   HISTORY  Chief Complaint Chest Pain    HPI Daniel Higgins is a 20 y.o. male reports no previous medical history.  Patient tells me that he is at the beach, he mild headache yesterday and was given a pill called D.Bal.Max from a friend to help with the headache. He took it, and his headache is gone away but he reports that he's felt shaky, and lightheaded. He looked up side effects of this medication online, and saw that it can cause problems. His symptoms he reports are better now, but he was having some feeling of lightheadedness and an uneasy feeling across his chest yesterday. This is completely resolved now.  Chest pain was not described as a heaviness or pressure, but rather hard to describe uneasiness. He has no symptoms or discomfort now. No nausea or vomiting. The pain was not radiating. His headache is completely gone away. He reports all symptoms seem to be better. He is once no visual way to "get the medication out".   No past medical history on file.  There are no active problems to display for this patient.   No past surgical history on file.  Current Outpatient Rx  Name  Route  Sig  Dispense  Refill  . Cetirizine HCl (ZYRTEC ALLERGY) 10 MG CAPS   Oral   Take 1 capsule (10 mg total) by mouth every morning.   30 capsule   0   . ibuprofen (ADVIL,MOTRIN) 800 MG tablet   Oral   Take 1 tablet (800 mg total) by mouth every 6 (six) hours as needed. Patient not taking: Reported on 12/01/2014   30 tablet   0     Allergies Review of patient's allergies indicates no known allergies.  No family history on file.  Social History Social History  Substance Use Topics  . Smoking status: Current Some Day Smoker    Types: Cigars  . Smokeless tobacco: None   . Alcohol Use: No    Review of Systems Constitutional: No fever/chills Eyes: No visual changes. ENT: No sore throat. Cardiovascular: See history of present illness Respiratory: Denies shortness of breath. Gastrointestinal: No abdominal pain.  No nausea, no vomiting.  No diarrhea.  No constipation. Genitourinary: Negative for dysuria. Musculoskeletal: Negative for back pain. Skin: Negative for rash. Neurological: Negative for  focal weakness or numbness. Did feel kind of "lightheaded" yesterday evening and this morning, now resolved.  10-point ROS otherwise negative.  ____________________________________________   PHYSICAL EXAM:  VITAL SIGNS: ED Triage Vitals  Enc Vitals Group     BP 08/13/15 1516 108/63 mmHg     Pulse Rate 08/13/15 1516 76     Resp 08/13/15 1516 18     Temp 08/13/15 1516 98.4 F (36.9 C)     Temp Source 08/13/15 1516 Oral     SpO2 08/13/15 1516 100 %     Weight 08/13/15 1516 120 lb (54.432 kg)     Height 08/13/15 1516 5\' 9"  (1.753 m)     Head Cir --      Peak Flow --      Pain Score 08/13/15 1517 8     Pain Loc --      Pain Edu? --      Excl. in GC? --    Constitutional: Alert and oriented. Well appearing and in  no acute distress. Eyes: Conjunctivae are normal. PERRL. EOMI. Head: Atraumatic. Nose: No congestion/rhinnorhea. Mouth/Throat: Mucous membranes are moist.  Oropharynx non-erythematous. Neck: No stridor.   Cardiovascular: Normal rate, regular rhythm. Grossly normal heart sounds.  Good peripheral circulation. Respiratory: Normal respiratory effort.  No retractions. Lungs CTAB. Gastrointestinal: Soft and nontender. No distention. No abdominal bruits. No CVA tenderness. Musculoskeletal: No lower extremity tenderness nor edema.  No joint effusions. Neurologic:  Normal speech and language. No gross focal neurologic deficits are appreciated. No gait instability. Skin:  Skin is warm, dry and intact. No rash noted. Psychiatric: Mood and affect are  normal. Speech and behavior are normal.  ____________________________________________   LABS (all labs ordered are listed, but only abnormal results are displayed)  Labs Reviewed  BASIC METABOLIC PANEL  CBC  TROPONIN I   ____________________________________________  EKG  ED ECG REPORT I, QUALE, MARK, the attending physician, personally viewed and interpreted this ECG.  Date: 08/13/2015 EKG Time: 1725 Rate: 70 Rhythm: normal sinus rhythm QRS Axis: normal Intervals: normal ST/T Wave abnormalities: normal Conduction Disturbances: none Narrative Interpretation: unremarkable  No estrogen use. No recent long trips or travel. No recent surgeries. No history of blood clots. ____________________________________________  RADIOLOGY  DG Chest 2 View (Final result) Result time: 08/13/15 16:10:57   Final result by Rad Results In Interface (08/13/15 16:10:57)   Narrative:   CLINICAL DATA: Chest discomfort.  EXAM: CHEST 2 VIEW  COMPARISON: 12/01/2014  FINDINGS: Normal heart size and mediastinal contours. No infiltrate or edema. No effusion or pneumothorax. No acute osseous findings.  IMPRESSION: Negative chest.   Electronically Signed By: Marnee Spring M.D. On: 08/13/2015 16:10    ____________________________________________   PROCEDURES  Procedure(s) performed: None  Critical Care performed: No  ____________________________________________   INITIAL IMPRESSION / ASSESSMENT AND PLAN / ED COURSE  Pertinent labs & imaging results that were available during my care of the patient were reviewed by me and considered in my medical decision making (see chart for details).  Patient presents for evaluation of lightheadedness, and chest discomfort experienced yesterday after taking a new medication and he was given by a friend. His symptoms have improved now, but he was feeling lightheaded and having a sense of uneasiness in the chest. His EKG is normal,  and I will obtain a chest x-ray as he is a rather skinny individual to rule out pneumothorax, I find it extremely unlikely. He has not had any sharp or pleuritic pain. No risk factors for pulmonary embolism. He is negative by perk rule. EKG appears nonischemic. Very atypical of any acute coronary pain and he has no risk factor.  Counseled the patient and not taking medications for which she is not familiar, and he agrees that he'll never do this again. His symptoms have gone away now, and I feel confident the patient can be discharged safely.  Return precautions and treatment recommendations and follow-up discussed with the patient who is agreeable with the plan.  ____________________________________________   FINAL CLINICAL IMPRESSION(S) / ED DIAGNOSES  Final diagnoses:  Medication side effect, initial encounter      Sharyn Creamer, MD 08/13/15 1653

## 2015-08-14 DIAGNOSIS — R079 Chest pain, unspecified: Secondary | ICD-10-CM | POA: Insufficient documentation

## 2015-08-14 DIAGNOSIS — Z79899 Other long term (current) drug therapy: Secondary | ICD-10-CM | POA: Insufficient documentation

## 2015-08-14 DIAGNOSIS — F17210 Nicotine dependence, cigarettes, uncomplicated: Secondary | ICD-10-CM | POA: Insufficient documentation

## 2015-08-14 DIAGNOSIS — R002 Palpitations: Secondary | ICD-10-CM | POA: Insufficient documentation

## 2015-08-15 ENCOUNTER — Encounter: Payer: Self-pay | Admitting: Urgent Care

## 2015-08-15 ENCOUNTER — Emergency Department: Payer: Self-pay

## 2015-08-15 ENCOUNTER — Emergency Department
Admission: EM | Admit: 2015-08-15 | Discharge: 2015-08-15 | Disposition: A | Discharge status: Home / Self Care | Payer: Self-pay | Attending: Emergency Medicine | Admitting: Emergency Medicine

## 2015-08-15 DIAGNOSIS — R002 Palpitations: Secondary | ICD-10-CM

## 2015-08-15 DIAGNOSIS — R079 Chest pain, unspecified: Secondary | ICD-10-CM

## 2015-08-15 LAB — BASIC METABOLIC PANEL
Anion gap: 8 (ref 5–15)
BUN: 10 mg/dL (ref 6–20)
CALCIUM: 9.4 mg/dL (ref 8.9–10.3)
CO2: 29 mmol/L (ref 22–32)
CREATININE: 0.84 mg/dL (ref 0.61–1.24)
Chloride: 101 mmol/L (ref 101–111)
GFR calc Af Amer: >60 mL/min (ref >60)
GLUCOSE: 88 mg/dL (ref 65–99)
Potassium: 3.5 mmol/L (ref 3.5–5.1)
Sodium: 138 mmol/L (ref 135–145)

## 2015-08-15 LAB — CBC
HCT: 41.2 % (ref 40.0–52.0)
Hemoglobin: 13.6 g/dL (ref 13.0–18.0)
MCH: 28 pg (ref 26.0–34.0)
MCHC: 33 g/dL (ref 32.0–36.0)
MCV: 84.8 fL (ref 80.0–100.0)
Platelets: 187 10*3/uL (ref 150–440)
RBC: 4.86 MIL/uL (ref 4.40–5.90)
RDW: 14.2 % (ref 11.5–14.5)
WBC: 9.7 10*3/uL (ref 3.8–10.6)

## 2015-08-15 LAB — TROPONIN I

## 2015-08-15 NOTE — ED Notes (Signed)
Patient presents with c/o a non-specific headache, dizziness, and palpitations upon standing. Symptoms x 3 days. Denies chest pain and SOB.

## 2015-08-15 NOTE — ED Provider Notes (Signed)
[facility] Emergency Department Provider Note   ____________________________________________  Time seen: Approximately 4 AM  I have reviewed the triage vital signs and the nursing notes.   HISTORY  Chief Complaint Headache and Palpitations   HPI Daniel Higgins is a 20 y.o. male without any chronic medical problems was presenting to the emergency department today with 3 days of intermittent palpitations and left-sided chest pain.  He says that the symptoms are intermittent and last about 3 minutes at a time. He says that the pain has been reading from the left side of his chest up to his left jaw. Although the initial nursing notes say that he was having nausea, the patient denies this to me. He also denies shortness of breath, vomiting and diaphoresis. He denies any pain with deep breathing. Says that the symptoms started after he took 3 pills from a friend to treat a headache several days ago.  D-Bal Max was the substance ingested which includes an anabolic derivative from insects. He does not take this regularly and has never taken it before. He denies smoking, drinking and drug use. He denies any chest pain at this time. He describes the pain as a pounding, like his heart is racing. He denies anything that brings on the symptoms such as exertion.   History reviewed. No pertinent past medical history.  There are no active problems to display for this patient.   History reviewed. No pertinent past surgical history.  Current Outpatient Rx  Name  Route  Sig  Dispense  Refill  . Cetirizine HCl (ZYRTEC ALLERGY) 10 MG CAPS   Oral   Take 1 capsule (10 mg total) by mouth every morning.   30 capsule   0   . ibuprofen (ADVIL,MOTRIN) 800 MG tablet   Oral   Take 1 tablet (800 mg total) by mouth every 6 (six) hours as needed. Patient not taking: Reported on 12/01/2014   30 tablet   0     Allergies Review of patient's allergies indicates no known  allergies.  No family history on file.  Social History Social History  Substance Use Topics  . Smoking status: Current Some Day Smoker    Types: Cigars  . Smokeless tobacco: None  . Alcohol Use: No    Review of Systems Constitutional: No fever/chills Eyes: No visual changes. ENT: No sore throat. Cardiovascular: As above Respiratory: Denies shortness of breath. Gastrointestinal: No abdominal pain.  No nausea, no vomiting.  No diarrhea.  No constipation. Genitourinary: Negative for dysuria. Musculoskeletal: Negative for back pain. Skin: Negative for rash. Neurological: Negative for headaches, focal weakness or numbness.  10-point ROS otherwise negative.  ____________________________________________   PHYSICAL EXAM:  VITAL SIGNS: ED Triage Vitals  Enc Vitals Group     BP 08/15/15 0010 99/74 mmHg     Pulse Rate 08/15/15 0010 76     Resp 08/15/15 0010 14     Temp 08/15/15 0010 98.6 F (37 C)     Temp Source 08/15/15 0010 Oral     SpO2 08/15/15 0010 97 %     Weight 08/15/15 0010 120 lb (54.432 kg)     Height 08/15/15 0010  (1.753 m)     Head Cir --      Peak Flow --      Pain Score 08/15/15 0011 0     Pain Loc --      Pain Edu? --      Excl. in GC? --  Constitutional: Alert and oriented. Well appearing and in no acute distress. Eyes: Conjunctivae are normal. PERRL. EOMI. Head: Atraumatic. Nose: No congestion/rhinnorhea. Mouth/Throat: Mucous membranes are moist.  Neck: No stridor.   Cardiovascular: Normal rate, regular rhythm. Grossly normal heart sounds.  Good peripheral circulation With equal and bilateral carotid as well as radial pulses. Chest pain is not reproducible to palpation. Respiratory: Normal respiratory effort.  No retractions. Lungs CTAB. Gastrointestinal: Soft and nontender. No distention.  Musculoskeletal: No lower extremity tenderness nor edema.  No joint effusions. Neurologic:  Normal speech and language. No gross focal neurologic  deficits are appreciated.  Skin:  Skin is warm, dry and intact. No rash noted. Psychiatric: Mood and affect are normal. Speech and behavior are normal.  ____________________________________________   LABS (all labs ordered are listed, but only abnormal results are displayed)  Labs Reviewed  BASIC METABOLIC PANEL  CBC  TROPONIN I   ____________________________________________  EKG  ED ECG REPORT I, Ginnifer Creelman,  Teena Iraniavid M, the attending physician, personally viewed and interpreted this ECG.   Date: 08/15/2015  EKG Time: 00 15  Rate: 75  Rhythm: normal sinus rhythm  Axis: Normal  Intervals:Minimal criteria for LVH, may be normal variant.  ST&T Change: Mild ST segment elevation in 2, 3 and F as well as V2 through 4 consistent with J-point elevation.  ____________________________________________  RADIOLOGY   DG Chest 2 View (Final result) Result time: 08/15/15 04:54:18   Final result by Rad Results In Interface (08/15/15 04:54:18)   Narrative:   CLINICAL DATA: Initial evaluation for acute chest pain, palpitations.  EXAM: CHEST 2 VIEW  COMPARISON: Prior radiograph from 08/13/2015.  FINDINGS: The cardiac and mediastinal silhouettes are stable in size and contour, and remain within normal limits.  The lungs are normally inflated. No airspace consolidation, pleural effusion, or pulmonary edema is identified. There is no pneumothorax.  No acute osseous abnormality identified.  IMPRESSION: No active cardiopulmonary disease.   Electronically Signed By: Rise MuBenjamin McClintock M.D. On: 08/15/2015 04:54       ____________________________________________   PROCEDURES    ____________________________________________   INITIAL IMPRESSION / ASSESSMENT AND PLAN / ED COURSE  Pertinent labs & imaging results that were available during my care of the patient were reviewed by me and considered in my medical decision making (see chart for  details).  ----------------------------------------- 5:12 AM on 08/15/2015 -----------------------------------------  Patient continues to be without chest pain at this time. Asleep but easily aroused. EKG is consistent with young healthy, thin male. I advised him to make sure to not take any pills from friends in the future. I suspect that his symptoms are related to the substance he took 3 days ago.PERC negative.  Will be discharged home. Patient understands the plan and is willing to comply. ____________________________________________   FINAL CLINICAL IMPRESSION(S) / ED DIAGNOSES  Chest pain with palpitations.    NEW MEDICATIONS STARTED DURING THIS VISIT:  New Prescriptions   No medications on file     Note:  This document was prepared using Dragon voice recognition software and may include unintentional dictation errors.    Myrna Blazeravid Matthew Jordayn Mink, MD 08/15/15 [id number]

## 2015-08-15 NOTE — ED Notes (Signed)
MD Shaevitz at bedside. 

## 2015-08-15 NOTE — ED Notes (Signed)
Spoke with Manson PasseyBrown, MD regarding presenting c/o and triage assessment. EKG reviewed by EDP. MD with [facility] for CBC, BMP, and Troponin only at this time.

## 2015-08-15 NOTE — ED Notes (Signed)
Reviewed d/c instructions, follow-up care with pt. Pt verbalized understanding 

## 2015-08-15 NOTE — ED Notes (Signed)
MD at bedside. 

## 2015-08-15 NOTE — ED Notes (Signed)
Pt c/o intermittent chest pain radiating to jaw/left arm, nausea, weakness, dizziness, SOB X 3 days. Pt also reports he believes he may be dehydrated. Pt was at beach when symptoms first began - pt left early due to symptoms.

## 2015-09-07 ENCOUNTER — Encounter: Payer: Self-pay | Admitting: Emergency Medicine

## 2015-09-07 ENCOUNTER — Emergency Department
Admission: EM | Admit: 2015-09-07 | Discharge: 2015-09-07 | Disposition: A | Discharge status: Home / Self Care | Payer: Medicaid Other | Attending: Emergency Medicine | Admitting: Emergency Medicine

## 2015-09-07 DIAGNOSIS — Y939 Activity, unspecified: Secondary | ICD-10-CM | POA: Insufficient documentation

## 2015-09-07 DIAGNOSIS — W19XXXA Unspecified fall, initial encounter: Secondary | ICD-10-CM | POA: Insufficient documentation

## 2015-09-07 DIAGNOSIS — Y999 Unspecified external cause status: Secondary | ICD-10-CM | POA: Insufficient documentation

## 2015-09-07 DIAGNOSIS — Z79899 Other long term (current) drug therapy: Secondary | ICD-10-CM | POA: Insufficient documentation

## 2015-09-07 DIAGNOSIS — F17210 Nicotine dependence, cigarettes, uncomplicated: Secondary | ICD-10-CM | POA: Insufficient documentation

## 2015-09-07 DIAGNOSIS — Y929 Unspecified place or not applicable: Secondary | ICD-10-CM | POA: Insufficient documentation

## 2015-09-07 DIAGNOSIS — S46912A Strain of unspecified muscle, fascia and tendon at shoulder and upper arm level, left arm, initial encounter: Secondary | ICD-10-CM

## 2015-09-07 MED ORDER — IBUPROFEN 600 MG PO TABS
600.0000 mg | ORAL_TABLET | Freq: Once | ORAL | Status: AC
  Administered 2015-09-07: 600 mg via ORAL
  Filled 2015-09-07: qty 1

## 2015-09-07 MED ORDER — TRAMADOL HCL 50 MG PO TABS: 50.0000 mg | ORAL_TABLET | Freq: Four times a day (QID) | ORAL | Status: DC | PRN

## 2015-09-07 MED ORDER — NAPROXEN 500 MG PO TABS: 500.0000 mg | ORAL_TABLET | Freq: Two times a day (BID) | ORAL | Status: DC

## 2015-09-07 MED ORDER — METHOCARBAMOL 750 MG PO TABS: 750.0000 mg | ORAL_TABLET | Freq: Four times a day (QID) | ORAL | Status: DC

## 2015-09-07 MED ORDER — OXYCODONE-ACETAMINOPHEN 5-325 MG PO TABS
1.0000 | ORAL_TABLET | Freq: Once | ORAL | Status: AC
  Administered 2015-09-07: 1 via ORAL
  Filled 2015-09-07: qty 1

## 2015-09-07 NOTE — Discharge Instructions (Signed)
Wear arm sling for 2-3 days as needed. °

## 2015-09-07 NOTE — ED Notes (Signed)
States shoulder "popped out of place" while stretching his arms up.

## 2015-09-07 NOTE — ED Provider Notes (Signed)
[facility] Emergency Department Provider Note   ____________________________________________  Time seen: Approximately 9:36 PM  I have reviewed the triage vital signs and the nursing notes.   HISTORY  Chief Complaint Shoulder Pain    HPI Daniel Higgins is a 20 y.o. male patient complaining of left posterior shoulder pain secondary to a stretching incident yesterday. Patient stated fell a popping sensation while reaching stretching overhead. Patient states since is that he has had posterior shoulder pain and decreased range of motion of overhead reaching. No palliative measures taken for this complaint. Patient rates his pain as 9/10. Patient described the pain as "sharp". Patient denies loss of sensation.   History reviewed. No pertinent past medical history.  There are no active problems to display for this patient.   History reviewed. No pertinent past surgical history.  Current Outpatient Rx  Name  Route  Sig  Dispense  Refill  . Cetirizine HCl (ZYRTEC ALLERGY) 10 MG CAPS   Oral   Take 1 capsule (10 mg total) by mouth every morning.   30 capsule   0   . ibuprofen (ADVIL,MOTRIN) 800 MG tablet   Oral   Take 1 tablet (800 mg total) by mouth every 6 (six) hours as needed. Patient not taking: Reported on 12/01/2014   30 tablet   0   . naproxen (NAPROSYN) 500 MG tablet   Oral   Take 1 tablet (500 mg total) by mouth 2 (two) times daily with a meal.   20 tablet   00   . traMADol (ULTRAM) 50 MG tablet   Oral   Take 1 tablet (50 mg total) by mouth every 6 (six) hours as needed.   20 tablet   0     Allergies Review of patient's allergies indicates no known allergies.  History reviewed. No pertinent family history.  Social History Social History  Substance Use Topics  . Smoking status: Current Some Day Smoker    Types: Cigars  . Smokeless tobacco: None  . Alcohol Use: No    Review of Systems Constitutional: No  fever/chills Eyes: No visual changes. ENT: No sore throat. Cardiovascular: Denies chest pain. Respiratory: Denies shortness of breath. Gastrointestinal: No abdominal pain.  No nausea, no vomiting.  No diarrhea.  No constipation. Genitourinary: Negative for dysuria. Musculoskeletal: Posterior left shoulder pain Skin: Negative for rash. Neurological: Negative for headaches, focal weakness or numbness.    ____________________________________________   PHYSICAL EXAM:  VITAL SIGNS: ED Triage Vitals  Enc Vitals Group     BP 09/07/15 2125 106/59 mmHg     Pulse Rate 09/07/15 2125 80     Resp 09/07/15 2125 18     Temp 09/07/15 2125 98.1 F (36.7 C)     Temp Source 09/07/15 2125 Oral     SpO2 09/07/15 2125 100 %     Weight 09/07/15 2125 129 lb 6.4 oz (58.695 kg)     Height 09/07/15 2125  (1.753 m)     Head Cir --      Peak Flow --      Pain Score 09/07/15 2131 9     Pain Loc --      Pain Edu? --      Excl. in GC? --     Constitutional: Alert and oriented. Well appearing and in no acute distress. Eyes: Conjunctivae are normal. PERRL. EOMI. Head: Atraumatic. Nose: No congestion/rhinnorhea. Mouth/Throat: Mucous membranes are moist.  Oropharynx non-erythematous. Neck: No stridor. No cervical spine  tenderness to palpation. Hematological/Lymphatic/Immunilogical: No cervical lymphadenopathy. Cardiovascular: Normal rate, regular rhythm. Grossly normal heart sounds.  Good peripheral circulation. Respiratory: Normal respiratory effort.  No retractions. Lungs CTAB. Gastrointestinal: Soft and nontender. No distention. No abdominal bruits. No CVA tenderness. Musculoskeletal: No obvious deformity to the left shoulder. Neurovascular intact. There is moderate guarding palpation superior aspect of the scapular muscle.  Neurologic:  Normal speech and language. No gross focal neurologic deficits are appreciated. No gait instability. Skin:  Skin is warm, dry and intact. No rash  noted. Psychiatric: Mood and affect are normal. Speech and behavior are normal.  ____________________________________________   LABS (all labs ordered are listed, but only abnormal results are displayed)  Labs Reviewed - No data to display ____________________________________________  EKG   ____________________________________________  RADIOLOGY   ____________________________________________   PROCEDURES  Procedure(s) performed: None  Procedures  Critical Care performed: No  ____________________________________________   INITIAL IMPRESSION / ASSESSMENT AND PLAN / ED COURSE  Pertinent labs & imaging results that were available during my care of the patient were reviewed by me and considered in my medical decision making (see chart for details).  Left scapular muscle strain. Patient given discharge care instructions. Patient placed in an arm sling for comfort advised to wear for 2-3 days as needed. Patient given a prescription for Robaxin, approximately, and tramadol. ____________________________________________   FINAL CLINICAL IMPRESSION(S) / ED DIAGNOSES  Final diagnoses:  Muscle strain of scapular region, left, initial encounter      NEW MEDICATIONS STARTED DURING THIS VISIT:  New Prescriptions   NAPROXEN (NAPROSYN) 500 MG TABLET    Take 1 tablet (500 mg total) by mouth 2 (two) times daily with a meal.   TRAMADOL (ULTRAM) 50 MG TABLET    Take 1 tablet (50 mg total) by mouth every 6 (six) hours as needed.     Note:  This document was prepared using Dragon voice recognition software and may include unintentional dictation errors.    Joni ReiningRonald K Eddie Payette, PA-C 09/07/15 2140  Emily FilbertJonathan E Williams, MD 09/07/15 [phone]

## 2015-09-09 ENCOUNTER — Emergency Department: Payer: Medicaid Other

## 2015-09-09 ENCOUNTER — Encounter: Payer: Self-pay | Admitting: Emergency Medicine

## 2015-09-09 DIAGNOSIS — Z5321 Procedure and treatment not carried out due to patient leaving prior to being seen by health care provider: Secondary | ICD-10-CM | POA: Insufficient documentation

## 2015-09-09 DIAGNOSIS — R079 Chest pain, unspecified: Secondary | ICD-10-CM | POA: Insufficient documentation

## 2015-09-09 LAB — BASIC METABOLIC PANEL
Anion gap: 7 (ref 5–15)
BUN: 13 mg/dL (ref 6–20)
CALCIUM: 9.9 mg/dL (ref 8.9–10.3)
CHLORIDE: 101 mmol/L (ref 101–111)
CO2: 31 mmol/L (ref 22–32)
CREATININE: 0.67 mg/dL (ref 0.61–1.24)
GFR calc non Af Amer: >60 mL/min (ref >60)
GLUCOSE: 90 mg/dL (ref 65–99)
Potassium: 3.3 mmol/L — ABNORMAL LOW (ref 3.5–5.1)
Sodium: 139 mmol/L (ref 135–145)

## 2015-09-09 LAB — CBC
HCT: 46.7 % (ref 40.0–52.0)
Hemoglobin: 15.6 g/dL (ref 13.0–18.0)
MCH: 28.2 pg (ref 26.0–34.0)
MCHC: 33.3 g/dL (ref 32.0–36.0)
MCV: 84.6 fL (ref 80.0–100.0)
PLATELETS: 190 10*3/uL (ref 150–440)
RBC: 5.51 MIL/uL (ref 4.40–5.90)
RDW: 14.3 % (ref 11.5–14.5)
WBC: 9.2 10*3/uL (ref 3.8–10.6)

## 2015-09-09 LAB — TROPONIN I

## 2015-09-09 NOTE — ED Notes (Signed)
Patient transported to X-ray 

## 2015-09-09 NOTE — ED Notes (Signed)
Patient ambulatory to triage with steady gait, pt reports chest pain began after doing pushups last night, pt states "I feel like my heart is skipping a beat." Pt reports pain is intermittent. Pt denies shortness of breath, nausea, vomiting, or dizziness. Pt alert and oriented x 4, no increased work in breathing noted.

## 2015-09-10 ENCOUNTER — Emergency Department
Admission: EM | Admit: 2015-09-10 | Discharge: 2015-09-10 | Disposition: A | Discharge status: Home / Self Care | Payer: Medicaid Other | Attending: Emergency Medicine | Admitting: Emergency Medicine

## 2015-11-23 ENCOUNTER — Emergency Department (HOSPITAL_COMMUNITY)
Admission: EM | Admit: 2015-11-23 | Discharge: 2015-11-23 | Disposition: A | Discharge status: Home / Self Care | Payer: Medicaid Other | Attending: Emergency Medicine | Admitting: Emergency Medicine

## 2015-11-23 ENCOUNTER — Emergency Department (HOSPITAL_COMMUNITY): Payer: Medicaid Other

## 2015-11-23 ENCOUNTER — Encounter (HOSPITAL_COMMUNITY): Payer: Self-pay

## 2015-11-23 DIAGNOSIS — Z791 Long term (current) use of non-steroidal anti-inflammatories (NSAID): Secondary | ICD-10-CM | POA: Insufficient documentation

## 2015-11-23 DIAGNOSIS — M25561 Pain in right knee: Secondary | ICD-10-CM

## 2015-11-23 DIAGNOSIS — Z87891 Personal history of nicotine dependence: Secondary | ICD-10-CM | POA: Insufficient documentation

## 2015-11-23 DIAGNOSIS — M25562 Pain in left knee: Secondary | ICD-10-CM | POA: Insufficient documentation

## 2015-11-23 MED ORDER — IBUPROFEN 800 MG PO TABS
800.0000 mg | ORAL_TABLET | Freq: Once | ORAL | Status: AC
  Administered 2015-11-23: 800 mg via ORAL
  Filled 2015-11-23: qty 1

## 2015-11-23 MED ORDER — IBUPROFEN 800 MG PO TABS: 800.0000 mg | ORAL_TABLET | Freq: Three times a day (TID) | ORAL | 0 refills | Status: DC

## 2015-11-23 NOTE — Discharge Instructions (Signed)
Take the prescribed medication as directed. May wish to apply ice to knee to help with pain. Follow-up with orthopedics if you continue having trouble with her knee. Otherwise you may follow-up with your primary care doctor. Return to the ED for new or worsening symptoms.

## 2015-11-23 NOTE — ED Provider Notes (Signed)
[facility]-EMERGENCY DEPT Provider Note   CSN: [id number] Arrival date & time: 11/23/15  0850     History   Chief Complaint Chief Complaint  Patient presents with  . Knee Pain  . Spasms    HPI Daniel Higgins is a 20 y.o. male.  The history is provided by the patient and medical records.  Knee Pain     20 y.o. M here with left knee pain.  States this morning he was laying in bed stretching when he had sudden onset of pain in the left knee.  State she is not sure if it dislocated or not.  EMS reports he was able to ambulate from his girlfriend's house to the ambulance. Upon arrival to the ED, he reports he cannot walk. Denies any numbness or weakness of his left leg. He reports history of "spasms" in her knee in the past. He states he was seen here but was never told to follow-up with anyone.  Patient also has concern of "bumps" in his mouth under his tongue. He reports he has been feeling them with his tongue for over a year now. He was concerned they were an abscess. He has not had any fever or chills. No dental pain. No sore throat or difficulty swallowing.  History reviewed. No pertinent past medical history.  There are no active problems to display for this patient.   History reviewed. No pertinent surgical history.     Home Medications    Prior to Admission medications   Medication Sig Start Date End Date Taking? Authorizing Provider  Cetirizine HCl (ZYRTEC ALLERGY) 10 MG CAPS Take 1 capsule (10 mg total) by mouth every morning. 12/02/14   April Palumbo, MD  ibuprofen (ADVIL,MOTRIN) 800 MG tablet Take 1 tablet (800 mg total) by mouth every 6 (six) hours as needed. Patient not taking: Reported on 12/01/2014 05/13/14   Earley Favor, NP  methocarbamol (ROBAXIN-750) 750 MG tablet Take 1 tablet (750 mg total) by mouth 4 (four) times daily. 09/07/15   Joni Reining, PA-C  naproxen (NAPROSYN) 500 MG tablet Take 1 tablet (500 mg total) by mouth 2 (two) times daily with a meal. 09/07/15    Joni Reining, PA-C  traMADol (ULTRAM) 50 MG tablet Take 1 tablet (50 mg total) by mouth every 6 (six) hours as needed. 09/07/15 09/06/16  Joni Reining, PA-C    Family History History reviewed. No pertinent family history.  Social History Social History  Substance Use Topics  . Smoking status: Former Smoker    Types: Cigars  . Smokeless tobacco: Never Used  . Alcohol use No     Allergies   Review of patient's allergies indicates no known allergies.   Review of Systems Review of Systems  Musculoskeletal: Positive for arthralgias.  All other systems reviewed and are negative.    Physical Exam Updated Vital Signs BP 116/58 (BP Location: Left Arm)   Pulse 88   Temp 98 F (36.7 C) (Oral)   Resp 18   Ht 5\' 9"  (1.753 m)   Wt 55.8 kg   SpO2 95%   BMI 18.16 kg/m   Physical Exam  Constitutional: He is oriented to person, place, and time. He appears well-developed and well-nourished.  HENT:  Head: Normocephalic and atraumatic.  Mouth/Throat: Oropharynx is clear and moist.  No signs of abscess or infectious process beneath the tongue; points to sublingual ducts/fold as the "bumps" he feels; no deformities noted, frenulum intact; normal musculature and movement of tongue  Eyes: Conjunctivae and EOM are normal. Pupils are equal, round, and reactive to light.  Neck: Normal range of motion.  Cardiovascular: Normal rate, regular rhythm and normal heart sounds.   Pulmonary/Chest: Effort normal and breath sounds normal.  Abdominal: Soft. Bowel sounds are normal.  Musculoskeletal: Normal range of motion.  Left knee normal in appearance, no swelling or bony deformities, limited flexion due to poor patient effort, full extension; DP pulse intact; normal strength/sensation of left leg swelling or asymmetry, no overlying skin changes, no warmth to touch  Neurological: He is alert and oriented to person, place, and time.  Skin: Skin is warm and dry.  Psychiatric: He has a normal mood  and affect.  Nursing note and vitals reviewed.    ED Treatments / Results  Labs (all labs ordered are listed, but only abnormal results are displayed) Labs Reviewed - No data to display  EKG  EKG Interpretation None       Radiology Dg Knee Complete 4 Views Left  Result Date: 11/23/2015 CLINICAL DATA:  Knee pain.  No trauma. EXAM: LEFT KNEE - COMPLETE 4+ VIEW COMPARISON:  09/02/2013 FINDINGS: No acute fracture or dislocation.  No joint effusion. IMPRESSION: No acute osseous abnormality. Electronically Signed   By: Jeronimo GreavesKyle  Talbot M.D.   On: 11/23/2015 09:42    Procedures Procedures (including critical care time)  Medications Ordered in ED Medications  ibuprofen (ADVIL,MOTRIN) tablet 800 mg (800 mg Oral Given 11/23/15 1001)     Initial Impression / Assessment and Plan / ED Course  I have reviewed the triage vital signs and the nursing notes.  Pertinent labs & imaging results that were available during my care of the patient were reviewed by me and considered in my medical decision making (see chart for details).  Clinical Course   20 year old male here with left knee pain. Reports acute onset of pain while stretching. There is no swelling, bony deformity, or signs of septic joint on exam. No clinical signs or symptoms concerning for DVT. Screening x-ray negative. Recommended ice and anti-inflammatories.   Regarding the "bumps" in his mouth -- this appears to be his sublingual fold/duct that he is feeling. There is no signs of dental abscess. He is handling his secretions well. Feel patient stable for discharge.  He was given orthopedic follow-up as this seems to be a recurrent issue for him.  Otherwise may follow-up with PCP. Discussed plan with patient, he acknowledged understanding and agreed with plan of care.  Return precautions given for new or worsening symptoms.  Final Clinical Impressions(s) / ED Diagnoses   Final diagnoses:  Right knee pain    New  Prescriptions Discharge Medication List as of 11/23/2015 10:25 AM    START taking these medications   Details  !! ibuprofen (ADVIL,MOTRIN) 800 MG tablet Take 1 tablet (800 mg total) by mouth 3 (three) times daily., Starting Sun 11/23/2015, Print     !! - Potential duplicate medications found. Please discuss with provider.       Garlon HatchetLisa M Marshay Slates, PA-C 11/23/15 1123    Mancel BaleElliott Wentz, MD 11/23/15 [phone]

## 2015-11-23 NOTE — ED Triage Notes (Addendum)
Per EMS, Pt, from a girlfriend's house, c/o L knee "cramping"/spasms starting this morning.  Pain score 9/10.  Denies injury.  Pt has not taken anything for symptoms.  Pt reported to EMS that this has happened before.  Pt concerned that his knee has "popped out of place."  No deformities noted.  EMS sts Pt ambulated to ambulance w/o assistance.        Upon assessment, no swelling or deformities noted.  Pt reports that he was stretching when the spasms began.  Also, Pt would like to be seen for a "bump" under his tongue which he has had for around 1 year.

## 2015-11-23 NOTE — ED Notes (Signed)
Pt asking for crackers and ginger ale.  When this writer entered the room to speak w/ the Pt, Pt's male visitor was in the bed w/ the Pt.  Pt and visitor informed that only the Pt is allowed in the bed.    Pt provided ginger ale and crackers.  Pt's visitor provided a Sprite.

## 2015-11-23 NOTE — ED Notes (Signed)
Bed: [id number] Expected date:  Expected time:  Means of arrival:  Comments: EMS 20 yo M, knee pain

## 2015-11-28 ENCOUNTER — Encounter (HOSPITAL_COMMUNITY): Payer: Self-pay | Admitting: Oncology

## 2015-11-28 ENCOUNTER — Emergency Department (HOSPITAL_COMMUNITY)
Admission: EM | Admit: 2015-11-28 | Discharge: 2015-11-29 | Disposition: A | Discharge status: Home / Self Care | Payer: Self-pay | Attending: Emergency Medicine | Admitting: Emergency Medicine

## 2015-11-28 ENCOUNTER — Emergency Department (HOSPITAL_COMMUNITY): Payer: Self-pay

## 2015-11-28 DIAGNOSIS — Z87891 Personal history of nicotine dependence: Secondary | ICD-10-CM | POA: Insufficient documentation

## 2015-11-28 DIAGNOSIS — Z79899 Other long term (current) drug therapy: Secondary | ICD-10-CM | POA: Insufficient documentation

## 2015-11-28 DIAGNOSIS — M25462 Effusion, left knee: Secondary | ICD-10-CM | POA: Insufficient documentation

## 2015-11-28 NOTE — ED Notes (Signed)
Pt  Not in lobby when called for a room

## 2015-11-28 NOTE — ED Triage Notes (Addendum)
Pt presents d/t painful, edematous left knee.  Denies any recent injury to knee.  Pt states that he woke up w/ the pain and swelling in the knee.  States prior to noticing the swelling he had a cramp in his left leg and was moving leg around in an attempt to alleviate pain from cramp.  Rates pain 10/10, sharp in nature.  Pt states he was seen here approximately a week ago for the same. Pt ambulatory to triage however is limping.  Wheelchair provided for pt.

## 2015-11-29 LAB — GRAM STAIN

## 2015-11-29 LAB — SYNOVIAL FLUID, CRYSTAL: CRYSTALS FLUID: NEGATIVE

## 2015-11-29 MED ORDER — LIDOCAINE HCL (PF) 1 % IJ SOLN
2.0000 mL | Freq: Once | INTRAMUSCULAR | Status: AC
  Administered 2015-11-29: 2 mL
  Filled 2015-11-29: qty 30

## 2015-11-29 MED ORDER — INDOMETHACIN 50 MG PO CAPS: 50.0000 mg | ORAL_CAPSULE | Freq: Two times a day (BID) | ORAL | 0 refills | Status: DC

## 2015-11-29 MED ORDER — IBUPROFEN 200 MG PO TABS
600.0000 mg | ORAL_TABLET | Freq: Once | ORAL | Status: AC
  Administered 2015-11-29: 600 mg via ORAL
  Filled 2015-11-29: qty 3

## 2015-11-29 MED ORDER — INDOMETHACIN 50 MG PO CAPS
50.0000 mg | ORAL_CAPSULE | Freq: Once | ORAL | Status: AC
  Administered 2015-11-29: 50 mg via ORAL
  Filled 2015-11-29: qty 1

## 2015-11-29 NOTE — ED Notes (Signed)
Patient verbalizes understanding of discharge instructions, prescriptions, home care and follow up care. Patient out of department at this time. 

## 2015-11-29 NOTE — Discharge Instructions (Signed)
Your the Ace bandage for support for the next several days U been given a prescription for Indocin which is an anti-inflammatory medicine and pain reliever.  Please take this as directed cultures have been taken of the fluid removed from your knee.  If there is any change in your treatment plan.  He'll be notified

## 2015-11-29 NOTE — ED Provider Notes (Signed)
[facility]-EMER[facility]ENC[facility] DEPT Provider Note   CSN: [id number] Arrival date & time: 11/28/15  1906  By signing my name below, I, Modena JanskyAlbert Thayil, attest that this documentation has been prepared under the direction and in the presence of non-physician practitioner, Earley FavorGail Leba Tibbitts, NP. Electronically Signed: Modena JanskyAlbert Thayil, Scribe. 11/29/2015. 12:30 AM.  History   Chief Complaint Chief Complaint  Patient presents with  . Knee Pain   The history is provided by the patient and medical records. No language interpreter was used.   HPI Comments: Daniel Higgins is a 20 y.o. male who presents to the Emergency Department complaining of constant moderate left knee pain that started about a week ago.  Per medical record, pt was seen in the ED on 11/23/15 for left knee pain with spasms. Since then he has been having worsening left knee swelling. He states the pain is exacerbated by movement. Denies any inability to ambulate.   History reviewed. No pertinent past medical history.  There are no active problems to display for this patient.   History reviewed. No pertinent surgical history.   Home Medications    Prior to Admission medications   Medication Sig Start Date End Date Taking? Authorizing Provider  Cetirizine HCl (ZYRTEC ALLERGY) 10 MG CAPS Take 1 capsule (10 mg total) by mouth every morning. Patient not taking: Reported on 11/23/2015 12/02/14   April Palumbo, MD  ibuprofen (ADVIL,MOTRIN) 800 MG tablet Take 1 tablet (800 mg total) by mouth every 6 (six) hours as needed. Patient not taking: Reported on 12/01/2014 05/13/14   Earley FavorGail Mousa Prout, NP  ibuprofen (ADVIL,MOTRIN) 800 MG tablet Take 1 tablet (800 mg total) by mouth 3 (three) times daily. Patient not taking: Reported on 11/29/2015 11/23/15   Garlon HatchetLisa M Sanders, PA-C  indomethacin (INDOCIN) 50 MG capsule Take 1 capsule (50 mg total) by mouth 2 (two) times daily with a meal. 11/29/15   Earley FavorGail Jep Dyas, NP  methocarbamol (ROBAXIN-750) 750 MG tablet Take 1 tablet (750 mg  total) by mouth 4 (four) times daily. Patient not taking: Reported on 11/23/2015 09/07/15   Joni Reiningonald K Smith, PA-C  naproxen (NAPROSYN) 500 MG tablet Take 1 tablet (500 mg total) by mouth 2 (two) times daily with a meal. Patient not taking: Reported on 11/23/2015 09/07/15   Joni Reiningonald K Smith, PA-C  traMADol (ULTRAM) 50 MG tablet Take 1 tablet (50 mg total) by mouth every 6 (six) hours as needed. Patient not taking: Reported on 11/23/2015 09/07/15 09/06/16  Joni Reiningonald K Smith, PA-C    Family History No family history on file.  Social History Social History  Substance Use Topics  . Smoking status: Former Smoker    Types: Cigars  . Smokeless tobacco: Never Used  . Alcohol use No     Allergies   Review of patient's allergies indicates no known allergies.   Review of Systems Review of Systems  Musculoskeletal: Positive for joint swelling and myalgias (Left knee). Negative for gait problem.  All other systems reviewed and are negative.    Physical Exam Updated Vital Signs BP 113/74 (BP Location: Right Arm)   Pulse 75   Temp 98.2 F (36.8 C) (Oral)   Resp 16   Ht 5\' 9"  (1.753 m)   Wt 123 lb (55.8 kg)   SpO2 99%   BMI 18.16 kg/m   Physical Exam  Constitutional: He appears well-developed and well-nourished. No distress.  HENT:  Head: Normocephalic and atraumatic.  Eyes: Conjunctivae are normal.  Neck: Neck supple.  Cardiovascular: Normal rate.  Pulmonary/Chest: Effort normal.  Abdominal: Soft.  Musculoskeletal: Normal range of motion.  Significant swelling to left knee. More TTP to the medial distal patella over the tibial plateau.   Neurological: He is alert.  Skin: Skin is warm and dry.  Psychiatric: He has a normal mood and affect.  Nursing note and vitals reviewed.    ED Treatments / Results  DIAGNOSTIC STUDIES: Oxygen Saturation is 99% on RA, normal by my interpretation.    COORDINATION OF CARE: 12:34 AM- Pt advised of plan for treatment and pt agrees.  Labs (all labs  ordered are listed, but only abnormal results are displayed) Labs Reviewed  BODY FLUID CULTURE  SYNOVIAL FLUID, CRYSTAL    EKG  EKG Interpretation None       Radiology Dg Knee Complete 4 Views Left  Result Date: 11/28/2015 CLINICAL DATA:  Left knee pain, spasms. EXAM: LEFT KNEE - COMPLETE 4+ VIEW COMPARISON:  None. FINDINGS: Osseous alignment is normal. Bone mineralization is normal. No fracture line or displaced fracture fragment. No acute or suspicious osseous lesion. Joint effusion is present, particularly prominent within the suprapatellar bursa. IMPRESSION: 1. Joint effusion, likely moderate or large in size. 2. No osseous abnormality. Electronically Signed   By: Bary Richard M.D.   On: 11/28/2015 20:15    Procedures .Joint Aspiration/Arthrocentesis Date/Time: 11/29/2015 2:02 AM Performed by: Earley Favor Authorized by: Earley Favor   Consent:    Consent obtained:  Verbal   Consent given by:  Patient   Risks discussed:  Pain, incomplete drainage, nerve damage and poor cosmetic result   Alternatives discussed:  No treatment Location:    Location:  Knee   Knee:  L knee Anesthesia (see MAR for exact dosages):    Anesthesia method:  Local infiltration   Local anesthetic:  Lidocaine 1% w/o epi Procedure details:    Needle gauge:  18 G   Ultrasound guidance: no     Approach:  Lateral   Aspirate amount:  45cc   Aspirate characteristics:  Serous and yellow   Steroid injected: no     Specimen collected: yes   Post-procedure details:    Dressing:  Adhesive bandage   Patient tolerance of procedure:  Tolerated well, no immediate complications   (including critical care time)  Medications Ordered in ED Medications  lidocaine (PF) (XYLOCAINE) 1 % injection 2 mL (2 mLs Infiltration Given 11/29/15 0045)  ibuprofen (ADVIL,MOTRIN) tablet 600 mg (600 mg Oral Given 11/29/15 0215)  indomethacin (INDOCIN) capsule 50 mg (50 mg Oral Given 11/29/15 0215)     Initial Impression /  Assessment and Plan / ED Course  I have reviewed the triage vital signs and the nursing notes.  Pertinent labs & imaging results that were available during my care of the patient were reviewed by me and considered in my medical decision making (see chart for details).  Clinical Course    45 cc orange color clear fluid removed sent for crystals and culture Final Clinical Impressions(s) / ED Diagnoses   Final diagnoses:  Knee effusion, left    New Prescriptions New Prescriptions   INDOMETHACIN (INDOCIN) 50 MG CAPSULE    Take 1 capsule (50 mg total) by mouth 2 (two) times daily with a meal.   I personally performed the services described in this documentation, which was scribed in my presence. The recorded information has been reviewed and is accurate.    Earley Favor, NP 11/29/15 0205    Earley Favor, NP 11/29/15 Tanya Nones    Dondra Spry  Manus Rudd, NP 11/29/15 1610    Benjiman Core, MD 11/29/15 [phone]

## 2015-12-04 LAB — CULTURE, BODY FLUID-BOTTLE

## 2015-12-04 LAB — CULTURE, BODY FLUID W GRAM STAIN -BOTTLE: Culture: NO GROWTH

## 2015-12-20 DIAGNOSIS — M25562 Pain in left knee: Secondary | ICD-10-CM | POA: Insufficient documentation

## 2015-12-20 DIAGNOSIS — Z5321 Procedure and treatment not carried out due to patient leaving prior to being seen by health care provider: Secondary | ICD-10-CM | POA: Insufficient documentation

## 2015-12-20 DIAGNOSIS — Z87891 Personal history of nicotine dependence: Secondary | ICD-10-CM | POA: Insufficient documentation

## 2015-12-21 ENCOUNTER — Emergency Department
Admission: EM | Admit: 2015-12-21 | Discharge: 2015-12-21 | Disposition: A | Discharge status: Home / Self Care | Payer: Self-pay | Attending: Emergency Medicine | Admitting: Emergency Medicine

## 2015-12-21 NOTE — ED Triage Notes (Signed)
Patient reports left knee pain for the past 2 weeks.  Reports started after left leg had a cramp in it.

## 2016-03-15 ENCOUNTER — Encounter (HOSPITAL_COMMUNITY): Payer: Self-pay | Admitting: Emergency Medicine

## 2016-03-15 ENCOUNTER — Emergency Department (HOSPITAL_COMMUNITY)
Admission: EM | Admit: 2016-03-15 | Discharge: 2016-03-15 | Disposition: A | Discharge status: Home / Self Care | Payer: Self-pay | Attending: Emergency Medicine | Admitting: Emergency Medicine

## 2016-03-15 DIAGNOSIS — Z79899 Other long term (current) drug therapy: Secondary | ICD-10-CM | POA: Insufficient documentation

## 2016-03-15 DIAGNOSIS — J029 Acute pharyngitis, unspecified: Secondary | ICD-10-CM | POA: Insufficient documentation

## 2016-03-15 DIAGNOSIS — Z87891 Personal history of nicotine dependence: Secondary | ICD-10-CM | POA: Insufficient documentation

## 2016-03-15 LAB — RAPID STREP SCREEN (MED CTR MEBANE ONLY): Streptococcus, Group A Screen (Direct): NEGATIVE

## 2016-03-15 MED ORDER — HYDROCODONE-ACETAMINOPHEN 7.5-325 MG/15ML PO SOLN: 15.0000 mL | Freq: Three times a day (TID) | ORAL | 0 refills | Status: DC | PRN

## 2016-03-15 MED ORDER — ACETAMINOPHEN 500 MG PO TABS
1000.0000 mg | ORAL_TABLET | Freq: Once | ORAL | Status: AC
  Administered 2016-03-15: 1000 mg via ORAL
  Filled 2016-03-15: qty 2

## 2016-03-15 MED ORDER — IBUPROFEN 600 MG PO TABS: 600.0000 mg | ORAL_TABLET | Freq: Four times a day (QID) | ORAL | 0 refills | Status: DC | PRN

## 2016-03-15 MED ORDER — DEXAMETHASONE SODIUM PHOSPHATE 10 MG/ML IJ SOLN
10.0000 mg | Freq: Once | INTRAMUSCULAR | Status: AC
  Administered 2016-03-15: 10 mg via INTRAMUSCULAR
  Filled 2016-03-15: qty 1

## 2016-03-15 NOTE — ED Notes (Signed)
Patient called to room 2x with no answer.

## 2016-03-15 NOTE — ED Provider Notes (Signed)
[facility] DEPT Provider Note   CSN: [id number] Arrival date & time: 03/15/16  1518  By signing my name below, I, Daniel Higgins, attest that this documentation has been prepared under the direction and in the presence of Daniel Higgins, [place]. Electronically Signed: Teofilo PodMatthew P. Higgins, ED Scribe. 03/15/2016. 5:42 PM.    History   Chief Complaint Chief Complaint  Patient presents with  . Cough  . Sore Throat  . Fever    The history is provided by the patient. No language interpreter was used.   HPI Comments:  Daniel Higgins is a 21 y.o. male who presents to the Emergency Department complaining of a constant sore throat x 3 days. Pt complains of associated fluctuating fever (Tmax 101). Pt has taken tylenol (last does 0100 today) and dayquil with no relief. Pt states that he is otherwise healthy, and denies sick contact. Pt denies congestion, rhinorrhea, ear pain, cough, SOB, chest pain, wheezing, abdominal pain, vomiting, diarrhea, dysuria, rash, drooling, dysuria, facial swelling, vocal changes.  History reviewed. No pertinent past medical history.  There are no active problems to display for this patient.   History reviewed. No pertinent surgical history.     Home Medications    Prior to Admission medications   Medication Sig Start Date End Date Taking? Authorizing Provider  Cetirizine HCl (ZYRTEC ALLERGY) 10 MG CAPS Take 1 capsule (10 mg total) by mouth every morning. Patient not taking: Reported on 11/23/2015 12/02/14   Daniel Palumbo, MD  HYDROcodone-acetaminophen (HYCET) 7.5-325 mg/15 ml solution Take 15 mLs by mouth every 8 (eight) hours as needed for moderate pain. 03/15/16   Daniel HenleNicole Elizabeth Nadeau, PA-C  ibuprofen (ADVIL,MOTRIN) 600 MG tablet Take 1 tablet (600 mg total) by mouth every 6 (six) hours as needed. 03/15/16   Daniel HenleNicole Elizabeth Nadeau, PA-C  indomethacin (INDOCIN) 50 MG capsule Take 1 capsule (50 mg total) by mouth 2 (two) times daily with a meal. 11/29/15    Daniel FavorGail Schulz, NP  methocarbamol (ROBAXIN-750) 750 MG tablet Take 1 tablet (750 mg total) by mouth 4 (four) times daily. Patient not taking: Reported on 11/23/2015 09/07/15   Daniel Reiningonald K Smith, PA-C  naproxen (NAPROSYN) 500 MG tablet Take 1 tablet (500 mg total) by mouth 2 (two) times daily with a meal. Patient not taking: Reported on 11/23/2015 09/07/15   Daniel Reiningonald K Smith, PA-C  traMADol (ULTRAM) 50 MG tablet Take 1 tablet (50 mg total) by mouth every 6 (six) hours as needed. Patient not taking: Reported on 11/23/2015 09/07/15 09/06/16  Daniel Reiningonald K Smith, PA-C    Family History No family history on file.  Social History Social History  Substance Use Topics  . Smoking status: Former Smoker    Types: Cigars  . Smokeless tobacco: Never Used  . Alcohol use No     Allergies   Patient has no known allergies.   Review of Systems Review of Systems  Constitutional: Positive for fever.  HENT: Positive for sore throat. Negative for congestion, drooling, ear pain, facial swelling, rhinorrhea and voice change.   Respiratory: Negative for cough, shortness of breath and wheezing.   Cardiovascular: Negative for chest pain.  Gastrointestinal: Negative for abdominal pain, diarrhea and vomiting.  Genitourinary: Negative for dysuria.  Skin: Negative for rash.     Physical Exam Updated Vital Signs BP 92/70 (BP Location: Right Arm)   Pulse 102   Temp 98.9 F (37.2 C) (Oral)   Resp 18   SpO2 100%   Physical Exam  Constitutional: He  appears well-developed and well-nourished. No distress.  HENT:  Head: Normocephalic and atraumatic.  Mouth/Throat: Uvula is midline and mucous membranes are normal. No oral lesions. No trismus in the jaw. No uvula swelling. Oropharyngeal exudate, posterior oropharyngeal edema and posterior oropharyngeal erythema present. No tonsillar abscesses. Tonsils are 2+ on the right. Tonsils are 2+ on the left. Tonsillar exudate.  No facial or neck swelling. Floor of mouth soft. Patient  tolerating secretions. No muffled voice.   Eyes: Conjunctivae and EOM are normal. Right eye exhibits no discharge. Left eye exhibits no discharge. No scleral icterus.  Neck: Normal range of motion. Neck supple.  Cardiovascular: Regular rhythm, normal heart sounds and intact distal pulses.   No murmur heard. Mildly tachycardic, heart rate 103  Pulmonary/Chest: Effort normal and breath sounds normal. No respiratory distress. He has no wheezes. He has no rales. He exhibits no tenderness.  Abdominal: Soft. Bowel sounds are normal. He exhibits no distension and no mass. There is no tenderness. There is no rebound and no guarding. No hernia.  Musculoskeletal: Normal range of motion. He exhibits no edema.  Lymphadenopathy:    He has cervical adenopathy (bilateral submandibular).  Neurological: He is alert.  Skin: Skin is warm and dry.  Psychiatric: He has a normal mood and affect.  Nursing note and vitals reviewed.    ED Treatments / Results  DIAGNOSTIC STUDIES:  Oxygen Saturation is 100% on RA, normal by my interpretation.    COORDINATION OF CARE:  5:42 PM Discussed treatment plan with pt at bedside and pt agreed to plan.   Labs (all labs ordered are listed, but only abnormal results are displayed) Labs Reviewed  RAPID STREP SCREEN (NOT AT [facility])  CULTURE, GROUP A STREP [facility])    EKG  EKG Interpretation None       Radiology No results found.  Procedures Procedures (including critical care time)  Medications Ordered in ED Medications  dexamethasone (DECADRON) injection 10 mg (10 mg Intramuscular Given 03/15/16 1755)  acetaminophen (TYLENOL) tablet 1,000 mg (1,000 mg Oral Given 03/15/16 1755)     Initial Impression / Assessment and Plan / ED Course  I have reviewed the triage vital signs and the nursing notes.  Pertinent labs & imaging results that were available during my care of the patient were reviewed by me and considered in my medical decision making (see chart for  details).  Clinical Course     Pt afebrile with tonsillar exudate, cervical lymphadenopathy, & dysphagia. Strep negative, suspect viral pharyngitis. Treated in the Ed with steroids, NSAIDs.  Pt appears well hydrated, discussed importance of continued water rehydration. Presentation non concerning for PTA or infxn spread to soft tissue. No trismus or uvula deviation. Specific return precautions discussed. Pt able to drink water in ED without difficulty with intact air way. Recommended PCP follow up.    Final Clinical Impressions(s) / ED Diagnoses   Final diagnoses:  Pharyngitis, unspecified etiology    New Prescriptions New Prescriptions   HYDROCODONE-ACETAMINOPHEN (HYCET) 7.5-325 MG/15 ML SOLUTION    Take 15 mLs by mouth every 8 (eight) hours as needed for moderate pain.   IBUPROFEN (ADVIL,MOTRIN) 600 MG TABLET    Take 1 tablet (600 mg total) by mouth every 6 (six) hours as needed.   I personally performed the services described in this documentation, which was scribed in my presence. The recorded information has been reviewed and is accurate.     Satira Sark [place], [place] 03/15/16 1836    Lorre Nick, MD 03/15/16  2316  

## 2016-03-15 NOTE — ED Triage Notes (Signed)
Pt complaint of sore throat, productive cough, and fever ongoing to 3 days unrelieved by Dayquil.

## 2016-03-15 NOTE — ED Notes (Signed)
Pt not found in room.

## 2016-03-15 NOTE — Discharge Instructions (Signed)
Take your medications as prescribed. Continue drinking fluids at home to remain hydrated. Follow-up with your family doctor in the next 3-4 days if your symptoms are not improved. Return to the emergency department if symptoms worsen or new onset of fever, facial or neck swelling, difficulty breathing, drooling due to being unable to swallow, vomiting, unable to keep fluids down, sensation of throat closing.

## 2016-03-15 NOTE — ED Notes (Signed)
Pt left prior to receiving d/c instructions or prescriptions.

## 2016-03-17 LAB — CULTURE, GROUP A STREP (THRC)

## 2016-04-11 ENCOUNTER — Emergency Department (HOSPITAL_COMMUNITY): Payer: Self-pay

## 2016-04-11 ENCOUNTER — Emergency Department (HOSPITAL_COMMUNITY)
Admission: EM | Admit: 2016-04-11 | Discharge: 2016-04-11 | Disposition: A | Discharge status: Home / Self Care | Payer: Self-pay | Attending: Emergency Medicine | Admitting: Emergency Medicine

## 2016-04-11 ENCOUNTER — Encounter (HOSPITAL_COMMUNITY): Payer: Self-pay | Admitting: *Deleted

## 2016-04-11 DIAGNOSIS — S6991XA Unspecified injury of right wrist, hand and finger(s), initial encounter: Secondary | ICD-10-CM | POA: Insufficient documentation

## 2016-04-11 DIAGNOSIS — X58XXXA Exposure to other specified factors, initial encounter: Secondary | ICD-10-CM | POA: Insufficient documentation

## 2016-04-11 DIAGNOSIS — Y9371 Activity, boxing: Secondary | ICD-10-CM | POA: Insufficient documentation

## 2016-04-11 DIAGNOSIS — Y999 Unspecified external cause status: Secondary | ICD-10-CM | POA: Insufficient documentation

## 2016-04-11 DIAGNOSIS — Z79899 Other long term (current) drug therapy: Secondary | ICD-10-CM | POA: Insufficient documentation

## 2016-04-11 DIAGNOSIS — Y929 Unspecified place or not applicable: Secondary | ICD-10-CM | POA: Insufficient documentation

## 2016-04-11 DIAGNOSIS — Z87891 Personal history of nicotine dependence: Secondary | ICD-10-CM | POA: Insufficient documentation

## 2016-04-11 MED ORDER — IBUPROFEN 800 MG PO TABS: 800.0000 mg | ORAL_TABLET | Freq: Three times a day (TID) | ORAL | 0 refills | Status: DC

## 2016-04-11 MED ORDER — IBUPROFEN 800 MG PO TABS
800.0000 mg | ORAL_TABLET | Freq: Once | ORAL | Status: AC
  Administered 2016-04-11: 800 mg via ORAL
  Filled 2016-04-11: qty 1

## 2016-04-11 NOTE — Discharge Instructions (Signed)
There were no fractures or other abnormalities on the x-rays. Use the wrist and hand ace wrap for support. Elevate the extremity whenever possible. Ibuprofen or naproxen to reduce pain and inflammation. Follow up with your primary care provider for any further management of this issue.

## 2016-04-11 NOTE — ED Triage Notes (Signed)
Pt was boxing two days ago and injured his right hand fingers. C/o pain in the 4th and 5th fingers and lateral hand area. No med pta.

## 2016-04-11 NOTE — ED Provider Notes (Signed)
[facility]-EMER[facility]Y DEPT Provider Note   CSN: [id number] Arrival date & time: 04/11/16  1808     History   Chief Complaint Chief Complaint  Patient presents with  . Hand Pain    HPI Daniel Higgins is a 21 y.o. male.  HPI   Ernestene MentionShamar R Shawnie Higgins is a 21 y.o. male, patient with no pertinent past medical history, presenting to the ED with Right hand injury that occurred 2-3 days ago. Patient states that he was boxing and had a sudden onset of pain to the medial right hand. Rates his pain 5/10, throbbing, radiating into the right wrist. Patient has not tried anything for his pain. Denies neuro deficits, other injuries, or any other complaints.    History reviewed. No pertinent past medical history.  There are no active problems to display for this patient.   History reviewed. No pertinent surgical history.     Home Medications    Prior to Admission medications   Medication Sig Start Date End Date Taking? Authorizing Provider  Cetirizine HCl (ZYRTEC ALLERGY) 10 MG CAPS Take 1 capsule (10 mg total) by mouth every morning. Patient not taking: Reported on 11/23/2015 12/02/14   April Palumbo, MD  HYDROcodone-acetaminophen (HYCET) 7.5-325 mg/15 ml solution Take 15 mLs by mouth every 8 (eight) hours as needed for moderate pain. 03/15/16   Barrett HenleNicole Elizabeth Nadeau, PA-C  ibuprofen (ADVIL,MOTRIN) 600 MG tablet Take 1 tablet (600 mg total) by mouth every 6 (six) hours as needed. 03/15/16   Barrett HenleNicole Elizabeth Nadeau, PA-C  indomethacin (INDOCIN) 50 MG capsule Take 1 capsule (50 mg total) by mouth 2 (two) times daily with a meal. 11/29/15   Earley FavorGail Schulz, NP  methocarbamol (ROBAXIN-750) 750 MG tablet Take 1 tablet (750 mg total) by mouth 4 (four) times daily. Patient not taking: Reported on 11/23/2015 09/07/15   Joni Reiningonald K Smith, PA-C  naproxen (NAPROSYN) 500 MG tablet Take 1 tablet (500 mg total) by mouth 2 (two) times daily with a meal. Patient not taking: Reported on 11/23/2015 09/07/15   Joni Reiningonald K Smith, PA-C    traMADol (ULTRAM) 50 MG tablet Take 1 tablet (50 mg total) by mouth every 6 (six) hours as needed. Patient not taking: Reported on 11/23/2015 09/07/15 09/06/16  Joni Reiningonald K Smith, PA-C    Family History No family history on file.  Social History Social History  Substance Use Topics  . Smoking status: Former Smoker    Types: Cigars  . Smokeless tobacco: Never Used  . Alcohol use No     Allergies   Patient has no known allergies.   Review of Systems Review of Systems  Musculoskeletal: Positive for arthralgias.  Skin: Negative for wound.  Neurological: Negative for weakness and numbness.     Physical Exam Updated Vital Signs BP 141/63   Pulse 82   Temp 98 F (36.7 C)   Resp 18   SpO2 97%   Physical Exam  Constitutional: He appears well-developed and well-nourished. No distress.  HENT:  Head: Normocephalic and atraumatic.  Eyes: Conjunctivae are normal.  Neck: Neck supple.  Cardiovascular: Normal rate, regular rhythm and intact distal pulses.   Pulmonary/Chest: Effort normal.  Musculoskeletal: He exhibits tenderness. He exhibits no edema or deformity.  Tenderness to the right medial hand over the fifth metacarpal. No obvious deformity, swelling, erythema, crepitus, or any other abnormality. Flexion and extension intact at the DIP, PIP, and MCP joints of each finger. Full range of motion in each cardinal direction of the wrist. No tenderness or  other abnormality into the forearm or elbow.  Neurological: He is alert.  No sensory deficits in the right hand or fingers. Strength 5 out of 5 in the right fingers.  Skin: Skin is warm and dry. Capillary refill takes less than 2 seconds. He is not diaphoretic.  Psychiatric: He has a normal mood and affect. His behavior is normal.  Nursing note and vitals reviewed.    ED Treatments / Results  Labs (all labs ordered are listed, but only abnormal results are displayed) Labs Reviewed - No data to display  EKG  EKG  Interpretation None       Radiology Dg Wrist Complete Right  Result Date: 04/11/2016 CLINICAL DATA:  Boxing injury 2 days ago.  Fifth metacarpal injury. EXAM: RIGHT HAND - COMPLETE 3+ VIEW; RIGHT WRIST - COMPLETE 3+ VIEW COMPARISON:  None. FINDINGS: No acute fracture deformity or dislocation. Joint spaces intact without erosions. No destructive bony lesions. Soft tissue planes are not suspicious. IMPRESSION: Negative. Electronically Signed   By: Awilda Metro M.D.   On: 04/11/2016 18:36   Dg Hand Complete Right  Result Date: 04/11/2016 CLINICAL DATA:  Boxing injury 2 days ago.  Fifth metacarpal injury. EXAM: RIGHT HAND - COMPLETE 3+ VIEW; RIGHT WRIST - COMPLETE 3+ VIEW COMPARISON:  None. FINDINGS: No acute fracture deformity or dislocation. Joint spaces intact without erosions. No destructive bony lesions. Soft tissue planes are not suspicious. IMPRESSION: Negative. Electronically Signed   By: Awilda Metro M.D.   On: 04/11/2016 18:36    Procedures Procedures (including critical care time)  Medications Ordered in ED Medications  ibuprofen (ADVIL,MOTRIN) tablet 800 mg (800 mg Oral Given 04/11/16 1836)     Initial Impression / Assessment and Plan / ED Course  I have reviewed the triage vital signs and the nursing notes.  Pertinent labs & imaging results that were available during my care of the patient were reviewed by me and considered in my medical decision making (see chart for details).      Patient presents with right hand injury that occurred 2-3 days ago. No abnormalities on x-ray. Supportive care discussed. PCP follow-up as needed.    Final Clinical Impressions(s) / ED Diagnoses   Final diagnoses:  Injury of right hand, initial encounter    New Prescriptions New Prescriptions   No medications on file     Daniel Higgins 04/11/16 1843    Raeford Razor, MD 04/19/16 [phone]

## 2016-08-19 ENCOUNTER — Encounter (HOSPITAL_COMMUNITY): Payer: Self-pay | Admitting: Emergency Medicine

## 2016-08-19 DIAGNOSIS — R531 Weakness: Secondary | ICD-10-CM | POA: Insufficient documentation

## 2016-08-19 DIAGNOSIS — Z87891 Personal history of nicotine dependence: Secondary | ICD-10-CM | POA: Insufficient documentation

## 2016-08-19 DIAGNOSIS — J029 Acute pharyngitis, unspecified: Secondary | ICD-10-CM | POA: Insufficient documentation

## 2016-08-19 DIAGNOSIS — R05 Cough: Secondary | ICD-10-CM | POA: Insufficient documentation

## 2016-08-19 DIAGNOSIS — M791 Myalgia: Secondary | ICD-10-CM | POA: Insufficient documentation

## 2016-08-19 DIAGNOSIS — H9209 Otalgia, unspecified ear: Secondary | ICD-10-CM | POA: Insufficient documentation

## 2016-08-19 DIAGNOSIS — R509 Fever, unspecified: Secondary | ICD-10-CM | POA: Insufficient documentation

## 2016-08-19 NOTE — ED Triage Notes (Signed)
Pt reports having fever, body aches, and ear pain for the last 3 days. Tylenol taken at 2147. Pt still reporting fever.

## 2016-08-19 NOTE — ED Notes (Signed)
Pt comes in with a fever today, and stated he has a hx of a monthly fever ever since he was in elementary school. Family hx of sickle cell anemia. Pt is requesting a blood test to determine the cause of the recurring fevers.

## 2016-08-20 ENCOUNTER — Emergency Department (HOSPITAL_COMMUNITY)
Admission: EM | Admit: 2016-08-20 | Discharge: 2016-08-20 | Disposition: A | Discharge status: Home / Self Care | Payer: Self-pay | Attending: Emergency Medicine | Admitting: Emergency Medicine

## 2016-08-20 DIAGNOSIS — R509 Fever, unspecified: Secondary | ICD-10-CM

## 2016-08-20 DIAGNOSIS — Z5321 Procedure and treatment not carried out due to patient leaving prior to being seen by health care provider: Secondary | ICD-10-CM | POA: Insufficient documentation

## 2016-08-20 DIAGNOSIS — R1110 Vomiting, unspecified: Secondary | ICD-10-CM | POA: Insufficient documentation

## 2016-08-20 LAB — RAPID STREP SCREEN (MED CTR MEBANE ONLY): STREPTOCOCCUS, GROUP A SCREEN (DIRECT): NEGATIVE

## 2016-08-20 LAB — CBG MONITORING, ED: GLUCOSE-CAPILLARY: 117 mg/dL — AB (ref 65–99)

## 2016-08-20 MED ORDER — ACETAMINOPHEN 325 MG PO TABS
650.0000 mg | ORAL_TABLET | Freq: Once | ORAL | Status: AC | PRN
  Administered 2016-08-20: 650 mg via ORAL
  Filled 2016-08-20: qty 2

## 2016-08-20 MED ORDER — IBUPROFEN 200 MG PO TABS
600.0000 mg | ORAL_TABLET | Freq: Once | ORAL | Status: AC
  Administered 2016-08-20: 600 mg via ORAL
  Filled 2016-08-20: qty 3

## 2016-08-20 NOTE — ED Notes (Signed)
Charge Nurse stated that she will come triage patient, but he will need to wait in the lobby until that time. Pt states "Aint this some shit."

## 2016-08-20 NOTE — Discharge Instructions (Signed)
Follow up with your doctor for further evaluation of recurrent fever. Your diabetes and strep tests here was negative. Take ibuprofen 400 mg and/or Tylenol 1000 mg every 4-6 hours for fever. Push fluids.

## 2016-08-20 NOTE — ED Triage Notes (Signed)
Called for triage x1.

## 2016-08-20 NOTE — ED Provider Notes (Signed)
[facility] Provider Note   CSN: [id number] Arrival date & time: 08/19/16  2200     History   Chief Complaint Chief Complaint  Patient presents with  . Fever  . Otalgia    HPI Daniel Higgins is a 21 y.o. male.  Patient presents with fever of Tmax 102 at home for 3 days. He reports generalized body aches, sore throat and ear pain. Minimal, non-productive cough. No nausea or vomiting. No rash. No sick contacts that he is aware of. He reports he gets a fever at least once a month for unknown reasons, but this has been recurrent since early age.    The history is provided by the patient. No language interpreter was used.  Fever   Associated symptoms include sore throat and cough. Pertinent negatives include no vomiting, no congestion and no headaches.  Otalgia  Associated symptoms include sore throat and cough. Pertinent negatives include no headaches, no vomiting and no rash.    History reviewed. No pertinent past medical history.  There are no active problems to display for this patient.   History reviewed. No pertinent surgical history.     Home Medications    Prior to Admission medications   Medication Sig Start Date End Date Taking? Authorizing Provider  HYDROcodone-acetaminophen (HYCET) 7.5-325 mg/15 ml solution Take 15 mLs by mouth every 8 (eight) hours as needed for moderate pain. Patient not taking: Reported on 08/20/2016 03/15/16   Barrett Henle, PA-C  ibuprofen (ADVIL,MOTRIN) 600 MG tablet Take 1 tablet (600 mg total) by mouth every 6 (six) hours as needed. Patient not taking: Reported on 08/20/2016 03/15/16   Barrett Henle, PA-C  ibuprofen (ADVIL,MOTRIN) 800 MG tablet Take 1 tablet (800 mg total) by mouth 3 (three) times daily. Patient not taking: Reported on 08/20/2016 04/11/16   Anselm Pancoast, PA-C    Family History History reviewed. No pertinent family history.  Social History Social History  Substance Use Topics  . Smoking  status: Former Smoker    Types: Cigars  . Smokeless tobacco: Never Used  . Alcohol use No     Allergies   Patient has no known allergies.   Review of Systems Review of Systems  Constitutional: Positive for fever.  HENT: Positive for ear pain and sore throat. Negative for congestion and trouble swallowing.   Eyes: Negative for discharge.  Respiratory: Positive for cough. Negative for shortness of breath.   Gastrointestinal: Negative for nausea and vomiting.  Genitourinary: Negative.   Musculoskeletal: Positive for myalgias.  Skin: Negative.  Negative for rash.  Neurological: Positive for weakness. Negative for syncope and headaches.     Physical Exam Updated Vital Signs BP 115/74 (BP Location: Right Arm)   Pulse (!) 102   Temp (!) 101.8 F (38.8 C) (Oral)   Resp (!) 21   Ht 5\' 9"  (1.753 m)   Wt 58.1 kg (128 lb 1.6 oz)   SpO2 93%   BMI 18.92 kg/m   Physical Exam  Constitutional: He is oriented to person, place, and time. He appears well-developed and well-nourished.  HENT:  Head: Normocephalic.  Oropharynx erythematous, without exudates or significant swelling. Mucosa moist.  Neck: Normal range of motion. Neck supple.  Cardiovascular: Normal rate and regular rhythm.   No murmur heard. Pulmonary/Chest: Effort normal and breath sounds normal. He has no wheezes. He has no rales.  Abdominal: Soft. Bowel sounds are normal. There is no tenderness. There is no rebound and no guarding.  Musculoskeletal: Normal range  of motion.  Neurological: He is alert and oriented to person, place, and time.  Skin: Skin is warm and dry. No rash noted.  Psychiatric: He has a normal mood and affect.     ED Treatments / Results  Labs (all labs ordered are listed, but only abnormal results are displayed) Labs Reviewed  RAPID STREP SCREEN (NOT AT [facility])    EKG  EKG Interpretation None       Radiology No results found.  Procedures Procedures (including critical care  time)  Medications Ordered in ED Medications  ibuprofen (ADVIL,MOTRIN) tablet 600 mg (600 mg Oral Given 08/20/16 0236)     Initial Impression / Assessment and Plan / ED Course  I have reviewed the triage vital signs and the nursing notes.  Pertinent labs & imaging results that were available during my care of the patient were reviewed by me and considered in my medical decision making (see chart for details).     Patient here for evaluation of fever. He reports fever episode frequently experienced since he was in high school. No known reason. He reports sore throat.  Strep negative.   He is tachycardic with fever of 103.1 which improves over time. Blood pressure stable. Ibuprofen given and he feels improved.   He can be discharged home with PCP follow up for further evaluation of recurrent fever. He does not appear toxic. He is drinking while in the room. He is felt appropriate for discharge home.    Final Clinical Impressions(s) / ED Diagnoses   Final diagnoses:  None   1. Febrile illness  New Prescriptions New Prescriptions   No medications on file     Danne HarborUpstill, Ghina Bittinger, PA-C 08/29/16 0025    Molpus, Jonny RuizJohn, MD 08/30/16 2241

## 2016-08-20 NOTE — ED Notes (Signed)
Pt states that he needs to speak to this RN's manager and refuses to let me finish triage. He states that he will not go back to the lobby after triage.

## 2016-08-20 NOTE — ED Notes (Signed)
Pt left before triage.

## 2016-08-22 LAB — CULTURE, GROUP A STREP (THRC)

## 2016-09-03 ENCOUNTER — Emergency Department (HOSPITAL_COMMUNITY)
Admission: EM | Admit: 2016-09-03 | Discharge: 2016-09-03 | Disposition: A | Discharge status: Home / Self Care | Payer: Self-pay | Attending: Emergency Medicine | Admitting: Emergency Medicine

## 2016-09-03 ENCOUNTER — Encounter (HOSPITAL_COMMUNITY): Payer: Self-pay | Admitting: Emergency Medicine

## 2016-09-03 DIAGNOSIS — Z113 Encounter for screening for infections with a predominantly sexual mode of transmission: Secondary | ICD-10-CM | POA: Insufficient documentation

## 2016-09-03 DIAGNOSIS — Z87891 Personal history of nicotine dependence: Secondary | ICD-10-CM | POA: Insufficient documentation

## 2016-09-03 LAB — URINALYSIS, ROUTINE W REFLEX MICROSCOPIC
BILIRUBIN URINE: NEGATIVE
Glucose, UA: NEGATIVE mg/dL
Hgb urine dipstick: NEGATIVE
KETONES UR: NEGATIVE mg/dL
Leukocytes, UA: NEGATIVE
NITRITE: NEGATIVE
PROTEIN: NEGATIVE mg/dL
Specific Gravity, Urine: 1.018 (ref 1.005–1.030)
pH: 6 (ref 5.0–8.0)

## 2016-09-03 NOTE — ED Notes (Signed)
Pt states that he is here to get STD check out. Pt denies pain at this time but then states " can I get an xray of my left knee, it has been hurting for a month, I had fluid drained off knee in Jan."

## 2016-09-03 NOTE — Discharge Instructions (Signed)
Today you were cultured for a sexually transmitted disease like chlamydia or gonorrhea. You have elected to wait to be treated at this time. You will receive a call in 48 hours if the test is positive. Please refrain from sexual activity for 48 hours. If the test is positive, please refrain from sexual activity for 10 days for the medicine to take effect and notify your sexual partners for the last 6 months as they, too, may want to be tested.   You have also been tested for HIV and Syphilis. Remember that latex condoms are the only way to prevent against STDs or HIV.  If you should develop severe or worsening pain in your abdomen or the pelvis or develop severe fevers,nausea or vomiting that prevent you from taking your medications, return to the emergency department immediately. Otherwise contact your local physician or county health department for a follow up appointment  See the list of phone numbers below.  RESOURCE GUIDE  Insufficient Money for Medicine Contact [employer]:  call "211" or [facility] [phone].  No Primary Care Doctor Call [facility]  [phone] Other agencies that provide inexpensive medical care    [facility]  [phone]    [facility]  [phone]    [facility]  [phone]    [facility]  [phone]    [facility]  [phone]    [facility]  [phone]  [facility] Services [facility]  [phone] [facility]  [phone] [facility]   [phone] (emergency services [phone]  Substance Abuse [facility]  [phone] [facility] [phone] [facility] [phone] [facility] [phone] Residential [facility] Outpatient [facility]stance Abuse Program  [phone]  Abuse/Neglect [facility] Hotline 8193421706(336) [phone] [facility] Hotline [phone] (After Hours)  [facility] [phone]  [facility] [phone] [facility] [phone]

## 2016-09-03 NOTE — ED Provider Notes (Signed)
[facility] Provider Note   CSN: [id number] Arrival date & time: 09/03/16  1940     History   Chief Complaint Chief Complaint  Patient presents with  . STD check    HPI Daniel Higgins is a 21 y.o. male presents today to the emergency department for STD check. The patient states that he has had 2 partners in the last year for which she is unsure if they have anything and would like to "be checked out for everything". The patient does not use protection. He does not have any symptoms. He denies penile pain, penile discharge, lesions, rash, testicular pain, anal pain, anal itching.  HPI  History reviewed. No pertinent past medical history.  There are no active problems to display for this patient.   History reviewed. No pertinent surgical history.     Home Medications    Prior to Admission medications   Medication Sig Start Date End Date Taking? Authorizing Provider  HYDROcodone-acetaminophen (HYCET) 7.5-325 mg/15 ml solution Take 15 mLs by mouth every 8 (eight) hours as needed for moderate pain. Patient not taking: Reported on 08/20/2016 03/15/16   Barrett HenleNadeau, Nicole Elizabeth, PA-C  ibuprofen (ADVIL,MOTRIN) 600 MG tablet Take 1 tablet (600 mg total) by mouth every 6 (six) hours as needed. Patient not taking: Reported on 08/20/2016 03/15/16   Barrett HenleNadeau, Nicole Elizabeth, PA-C  ibuprofen (ADVIL,MOTRIN) 800 MG tablet Take 1 tablet (800 mg total) by mouth 3 (three) times daily. Patient not taking: Reported on 08/20/2016 04/11/16   Anselm PancoastJoy, Shawn C, PA-C    Family History No family history on file.  Social History Social History  Substance Use Topics  . Smoking status: Former Smoker    Types: Cigars  . Smokeless tobacco: Never Used  . Alcohol use No     Allergies   Patient has no known allergies.   Review of Systems Review of Systems  Constitutional: Negative for fever.  Genitourinary: Negative for decreased urine volume, discharge, dysuria, hematuria, penile pain,  penile swelling, scrotal swelling, testicular pain and urgency.     Physical Exam Updated Vital Signs BP 110/79 (BP Location: Right Arm)   Pulse 91   Temp 98.1 F (36.7 C) (Oral)   Resp 18   Ht 5\' 9"  (1.753 m)   Wt 55.8 kg (123 lb)   SpO2 99%   BMI 18.16 kg/m   Physical Exam  Constitutional: He appears well-developed and well-nourished.  HENT:  Head: Normocephalic and atraumatic.  Right Ear: External ear normal.  Left Ear: External ear normal.  Eyes: Conjunctivae are normal. Right eye exhibits no discharge. Left eye exhibits no discharge. No scleral icterus.  Pulmonary/Chest: Effort normal. No respiratory distress.  Abdominal: There is no tenderness.  Genitourinary: Testes normal and penis normal. Right testis shows no tenderness. Left testis shows no tenderness. Circumcised. No penile erythema or penile tenderness. No discharge found.  Lymphadenopathy: No inguinal adenopathy noted on the right or left side.  Neurological: He is alert.  Skin: Skin is warm and dry. Diaphoretic: No rash or lesion. No pallor.  Psychiatric: He has a normal mood and affect.  Nursing note and vitals reviewed.    ED Treatments / Results  Labs (all labs ordered are listed, but only abnormal results are displayed) Labs Reviewed  URINALYSIS, ROUTINE W REFLEX MICROSCOPIC  RPR  HIV ANTIBODY (ROUTINE TESTING)  GC/CHLAMYDIA PROBE AMP ([place]) NOT AT [facility]    EKG  EKG Interpretation None       Radiology No results found.  Procedures Procedures (including critical care time)  Medications Ordered in ED Medications - No data to display   Initial Impression / Assessment and Plan / ED Course  I have reviewed the triage vital signs and the nursing notes.  Pertinent labs & imaging results that were available during my care of the patient were reviewed by me and considered in my medical decision making (see chart for details).     Pt presents with concerns for possible STD. Pt was  offered prophylactic antibiotics but denies at this time. Patient is afebrile without abdominal tenderness, abdominal pain. No tenderness to palpation of the testes or epididymis to suggest orchitis or epididymitis. He is asymptomatic. STD cultures obtained including HIV, syphilis, gonorrhea and chlamydia. Patient to be discharged with instructions to follow up with PCP. Discussed importance of using protection when sexually active. Pt understands that they have GC/Chlamydia cultures pending and that they will need to inform all sexual partners if results return positive. Return precautions discussed. All questions answered. Patient stable for discharge.   Final Clinical Impressions(s) / ED Diagnoses   Final diagnoses:  Screening examination for STD (sexually transmitted disease)    New Prescriptions New Prescriptions   No medications on file     Princella Pellegrini 09/03/16 2311    Pricilla Loveless, MD 09/05/16 2255

## 2016-09-03 NOTE — ED Triage Notes (Signed)
Pt from home with requesting STD check. Pt denies pain or discharge.

## 2016-09-04 LAB — HIV ANTIBODY (ROUTINE TESTING W REFLEX): HIV SCREEN 4TH GENERATION: NONREACTIVE

## 2016-09-04 LAB — RPR: RPR: NONREACTIVE

## 2016-09-06 LAB — GC/CHLAMYDIA PROBE AMP (~~LOC~~) NOT AT ARMC
CHLAMYDIA, DNA PROBE: POSITIVE — AB
Neisseria Gonorrhea: NEGATIVE

## 2016-09-07 ENCOUNTER — Emergency Department (HOSPITAL_COMMUNITY)
Admission: EM | Admit: 2016-09-07 | Discharge: 2016-09-07 | Disposition: A | Discharge status: Home / Self Care | Payer: Self-pay | Attending: Emergency Medicine | Admitting: Emergency Medicine

## 2016-09-07 ENCOUNTER — Encounter (HOSPITAL_COMMUNITY): Payer: Self-pay

## 2016-09-07 DIAGNOSIS — A749 Chlamydial infection, unspecified: Secondary | ICD-10-CM | POA: Insufficient documentation

## 2016-09-07 DIAGNOSIS — Z87891 Personal history of nicotine dependence: Secondary | ICD-10-CM | POA: Insufficient documentation

## 2016-09-07 MED ORDER — AZITHROMYCIN 250 MG PO TABS
1000.0000 mg | ORAL_TABLET | Freq: Once | ORAL | Status: AC
  Administered 2016-09-07: 1000 mg via ORAL
  Filled 2016-09-07: qty 4

## 2016-09-07 NOTE — ED Triage Notes (Signed)
Patient states he received a call from a lab and wastold he was positive for chlamydia.. Patient states he is here for a pill.

## 2016-09-07 NOTE — ED Provider Notes (Signed)
[facility]-EMERGENCY DEPT Provider Note    By signing my name below, I, Earmon PhoenixJennifer Waddell, attest that this documentation has been prepared under the direction and in the presence of [facility] HospitalEmily Inita Uram, PA-C. Electronically Signed: Earmon PhoenixJennifer Waddell, ED Scribe. 09/07/16. 6:33 PM.    History   Chief Complaint Chief Complaint  Patient presents with  . Exposure to STD    HPI  Daniel Higgins is a 21 y.o. male who presents to the Emergency Department complaining of a positive chlamydia test. He states he was seen here four days ago and had testing for GC/chlamydia but was not treated at that time. He has since received a phone call stating positive results. He has not taken anything for treatment and denies any symptoms. There are no modifying factors noted. He denies fever, chills, abdominal pain, nausea, vomiting, dysuria, genital sores or lesions, penile discharge/pain/swelling, testicular pain or swelling.   History reviewed. No pertinent past medical history.  There are no active problems to display for this patient.   History reviewed. No pertinent surgical history.     Home Medications    Prior to Admission medications   Medication Sig Start Date End Date Taking? Authorizing Provider  HYDROcodone-acetaminophen (HYCET) 7.5-325 mg/15 ml solution Take 15 mLs by mouth every 8 (eight) hours as needed for moderate pain. Patient not taking: Reported on 08/20/2016 03/15/16   Barrett HenleNadeau, Nicole Elizabeth, PA-C  ibuprofen (ADVIL,MOTRIN) 600 MG tablet Take 1 tablet (600 mg total) by mouth every 6 (six) hours as needed. Patient not taking: Reported on 08/20/2016 03/15/16   Barrett HenleNadeau, Nicole Elizabeth, PA-C  ibuprofen (ADVIL,MOTRIN) 800 MG tablet Take 1 tablet (800 mg total) by mouth 3 (three) times daily. Patient not taking: Reported on 08/20/2016 04/11/16   Anselm PancoastJoy, Shawn C, PA-C    Family History No family history on file.  Social History Social History  Substance Use Topics  . Smoking status: Former Smoker      Types: Cigars  . Smokeless tobacco: Never Used  . Alcohol use No     Allergies   Patient has no known allergies.   Review of Systems Review of Systems  Constitutional: Negative for chills and fever.  Gastrointestinal: Negative for abdominal pain, nausea and vomiting.  Genitourinary: Negative for discharge, dysuria, genital sores, penile pain, penile swelling, scrotal swelling and testicular pain.     Physical Exam Updated Vital Signs BP 115/69 (BP Location: Left Arm)   Pulse 81   Temp 99.2 F (37.3 C) (Oral)   Resp 12   Ht 5\' 9"  (1.753 m)   Wt 127 lb (57.6 kg)   SpO2 99%   BMI 18.75 kg/m   Physical Exam  Constitutional: He appears well-developed and well-nourished. No distress.  HENT:  Head: Normocephalic and atraumatic.  Neck: Neck supple.  Pulmonary/Chest: Effort normal.  Abdominal: Soft. There is no tenderness. There is no rebound and no guarding.  Genitourinary: Testes normal and penis normal. Right testis shows no mass, no swelling and no tenderness. Right testis is descended. Left testis shows no mass, no swelling and no tenderness. Left testis is descended. Circumcised. No penile erythema or penile tenderness. No discharge found.  Neurological: He is alert.  Skin: He is not diaphoretic.  Nursing note and vitals reviewed.    ED Treatments / Results  DIAGNOSTIC STUDIES: Oxygen Saturation is 99% on RA, normal by my interpretation.   COORDINATION OF CARE: 6:30 PM- Will treat for chlamydia. Pt verbalizes understanding and agrees to plan.  Medications  azithromycin (ZITHROMAX)  tablet 1,000 mg (1,000 mg Oral Given 09/07/16 1838)    Labs (all labs ordered are listed, but only abnormal results are displayed) Labs Reviewed - No data to display  EKG  EKG Interpretation None       Radiology No results found.  Procedures Procedures (including critical care time)  Medications Ordered in ED Medications  azithromycin (ZITHROMAX) tablet 1,000 mg  (1,000 mg Oral Given 09/07/16 1838)     Initial Impression / Assessment and Plan / ED Course  I have reviewed the triage vital signs and the nursing notes.  Pertinent labs & imaging results that were available during my care of the patient were reviewed by me and considered in my medical decision making (see chart for details).     Afebrile, nontoxic patient with positive chlamydia test.  Asymptomatic.  PO azithromycin given.   Pt aware sexual partners must also be tested and treated.  Pt advised to abstain from sex for 10 days following treatment. D/C home.  Discussed result, findings, treatment, and follow up  with patient.  Pt given return precautions.  Pt verbalizes understanding and agrees with plan.       Final Clinical Impressions(s) / ED Diagnoses   Final diagnoses:  Chlamydia    New Prescriptions Discharge Medication List as of 09/07/2016  6:30 PM       Trixie Dredge, Cordelia Poche 09/07/16 [place] Charon, MD 09/07/16 2259

## 2016-09-07 NOTE — Discharge Instructions (Signed)
Read the information below.  You may return to the Emergency Department at any time for worsening condition or any new symptoms that concern you.   If you develop fevers, abdominal pain, discharge from your penis, pain in your testicles or penis, or pain when you urinate, please return to the Emergency Department for a recheck.  Any sexual partners need to be tested and treated as well.

## 2016-10-24 ENCOUNTER — Emergency Department (HOSPITAL_COMMUNITY): Payer: Self-pay

## 2016-10-24 ENCOUNTER — Inpatient Hospital Stay (HOSPITAL_COMMUNITY)
Admission: EM | Admit: 2016-10-24 | Discharge: 2016-10-27 | DRG: 201 | Disposition: A | Discharge status: Home / Self Care | Payer: Self-pay | Attending: Thoracic Surgery (Cardiothoracic Vascular Surgery) | Admitting: Thoracic Surgery (Cardiothoracic Vascular Surgery)

## 2016-10-24 ENCOUNTER — Inpatient Hospital Stay (HOSPITAL_COMMUNITY): Payer: Self-pay

## 2016-10-24 ENCOUNTER — Encounter (HOSPITAL_COMMUNITY): Payer: Self-pay

## 2016-10-24 DIAGNOSIS — J9383 Other pneumothorax: Secondary | ICD-10-CM

## 2016-10-24 DIAGNOSIS — J9311 Primary spontaneous pneumothorax: Principal | ICD-10-CM

## 2016-10-24 DIAGNOSIS — Z9689 Presence of other specified functional implants: Secondary | ICD-10-CM

## 2016-10-24 DIAGNOSIS — J939 Pneumothorax, unspecified: Secondary | ICD-10-CM

## 2016-10-24 DIAGNOSIS — Z87891 Personal history of nicotine dependence: Secondary | ICD-10-CM

## 2016-10-24 LAB — CBC
HEMATOCRIT: 41.5 % (ref 39.0–52.0)
HEMOGLOBIN: 13.8 g/dL (ref 13.0–17.0)
MCH: 27.1 pg (ref 26.0–34.0)
MCHC: 33.3 g/dL (ref 30.0–36.0)
MCV: 81.4 fL (ref 78.0–100.0)
Platelets: 290 10*3/uL (ref 150–400)
RBC: 5.1 MIL/uL (ref 4.22–5.81)
RDW: 14.5 % (ref 11.5–15.5)
WBC: 8.2 10*3/uL (ref 4.0–10.5)

## 2016-10-24 LAB — BASIC METABOLIC PANEL
ANION GAP: 10 (ref 5–15)
BUN: 7 mg/dL (ref 6–20)
CHLORIDE: 98 mmol/L — AB (ref 101–111)
CO2: 24 mmol/L (ref 22–32)
Calcium: 9.5 mg/dL (ref 8.9–10.3)
Creatinine, Ser: 0.84 mg/dL (ref 0.61–1.24)
Glucose, Bld: 86 mg/dL (ref 65–99)
POTASSIUM: 3.4 mmol/L — AB (ref 3.5–5.1)
Sodium: 132 mmol/L — ABNORMAL LOW (ref 135–145)

## 2016-10-24 LAB — I-STAT TROPONIN, ED: Troponin i, poc: 0.02 ng/mL (ref 0.00–0.08)

## 2016-10-24 MED ORDER — MORPHINE SULFATE (PF) 4 MG/ML IV SOLN
2.0000 mg | Freq: Once | INTRAVENOUS | Status: AC
  Administered 2016-10-24: 2 mg via INTRAVENOUS
  Filled 2016-10-24: qty 1

## 2016-10-24 MED ORDER — ACETAMINOPHEN 325 MG PO TABS
650.0000 mg | ORAL_TABLET | Freq: Four times a day (QID) | ORAL | Status: DC | PRN
  Administered 2016-10-27: 650 mg via ORAL
  Filled 2016-10-24: qty 2

## 2016-10-24 MED ORDER — MORPHINE SULFATE (PF) 4 MG/ML IV SOLN
2.0000 mg | INTRAVENOUS | Status: DC | PRN
  Administered 2016-10-24 – 2016-10-27 (×3): 2 mg via INTRAVENOUS
  Filled 2016-10-24 (×3): qty 1

## 2016-10-24 MED ORDER — MORPHINE SULFATE 10 MG/ML IJ SOLN
INTRAMUSCULAR | Status: AC | PRN
  Administered 2016-10-24: 2 mg via INTRAVENOUS

## 2016-10-24 MED ORDER — OXYCODONE HCL 5 MG PO TABS
10.0000 mg | ORAL_TABLET | ORAL | Status: DC | PRN
  Administered 2016-10-24 – 2016-10-26 (×4): 10 mg via ORAL
  Filled 2016-10-24 (×4): qty 2

## 2016-10-24 MED ORDER — MIDAZOLAM HCL 2 MG/2ML IJ SOLN
INTRAMUSCULAR | Status: AC | PRN
  Administered 2016-10-24: 2 mg via INTRAVENOUS

## 2016-10-24 MED ORDER — SODIUM CHLORIDE 0.9 % IV SOLN
INTRAVENOUS | Status: AC | PRN
  Administered 2016-10-24: 1000 mL via INTRAVENOUS

## 2016-10-24 MED ORDER — MIDAZOLAM HCL 2 MG/2ML IJ SOLN
2.0000 mg | Freq: Once | INTRAMUSCULAR | Status: AC
  Administered 2016-10-24: 2 mg via INTRAVENOUS
  Filled 2016-10-24: qty 2

## 2016-10-24 MED ORDER — LIDOCAINE HCL (PF) 1 % IJ SOLN
30.0000 mL | Freq: Once | INTRAMUSCULAR | Status: DC
  Administered 2016-10-24: 30 mL
  Filled 2016-10-24: qty 30

## 2016-10-24 MED ORDER — DOCUSATE SODIUM 100 MG PO CAPS
200.0000 mg | ORAL_CAPSULE | Freq: Two times a day (BID) | ORAL | Status: DC
  Administered 2016-10-25 – 2016-10-27 (×5): 200 mg via ORAL
  Filled 2016-10-24 (×6): qty 2

## 2016-10-24 NOTE — ED Triage Notes (Signed)
Onset 30 min PTA pt was sitting in car and felt pain in left chest, shoulder, and back; and shortness of breath.  Pt talking in complete sentences.    Pt tearful at triage.

## 2016-10-24 NOTE — ED Provider Notes (Signed)
[facility]-EMERGENCY DEPT Provider Note   CSN: [id number] Arrival date & time: 10/24/16  1840     History   Chief Complaint Chief Complaint  Patient presents with  . Chest Pain    HPI Daniel Higgins is a 21 y.o. male.  Patient is a 21 year old male with no significant past medical history who presents with chest pain. He states he was seeing his car about 30 minutes prior to arrival and had sudden onset of sharp pain in his left back and chest. It radiates to his shoulder. He states it hurts worse when he breathes. He felt short of breath initially but denies any current shortness of breath. He states the pain has eased off a little bit. He denies any prior history of lung problems. He was smoking but denies any current smoking. Denies any drug use.      History reviewed. No pertinent past medical history.  Patient Active Problem List   Diagnosis Date Noted  . 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 Primary spontaneous pneumothorax 10/24/2016    History reviewed. No pertinent surgical history.     Home Medications    Prior to Admission medications   Not on File    Family History History reviewed. No pertinent family history.  Social History Social History  Substance Use Topics  . Smoking status: Former Smoker    Types: Cigars  . Smokeless tobacco: Never Used  . Alcohol use No     Allergies   Patient has no known allergies.   Review of Systems Review of Systems  Constitutional: Negative for chills, diaphoresis, fatigue and fever.  HENT: Negative for congestion, rhinorrhea and sneezing.   Eyes: Negative.   Respiratory: Positive for shortness of breath. Negative for cough and chest tightness.   Cardiovascular: Positive for chest pain. Negative for leg swelling.  Gastrointestinal: Negative for abdominal pain, blood in stool, diarrhea, nausea and vomiting.  Genitourinary: Negative for difficulty urinating, flank pain, frequency and hematuria.    Musculoskeletal: Negative for arthralgias and back pain.  Skin: Negative for rash.  Neurological: Negative for dizziness, speech difficulty, weakness, numbness and headaches.     Physical Exam Updated Vital Signs BP 118/74   Pulse 81   Temp (!) 97.4 F (36.3 C)   Resp (!) 22   SpO2 100%   Physical Exam  Constitutional: He is oriented to person, place, and time. He appears well-developed and well-nourished.  HENT:  Head: Normocephalic and atraumatic.  Eyes: Pupils are equal, round, and reactive to light.  Neck: Normal range of motion. Neck supple.  Cardiovascular: Normal rate, regular rhythm and normal heart sounds.   Pulmonary/Chest: Effort normal and breath sounds normal. No respiratory distress. He has no wheezes. He has no rales. He exhibits no tenderness.  Abdominal: Soft. Bowel sounds are normal. There is no tenderness. There is no rebound and no guarding.  Musculoskeletal: Normal range of motion. He exhibits no edema.  Lymphadenopathy:    He has no cervical adenopathy.  Neurological: He is alert and oriented to person, place, and time.  Skin: Skin is warm and dry. No rash noted.  Psychiatric: He has a normal mood and affect.     ED Treatments / Results  Labs (all labs ordered are listed, but only abnormal results are displayed) Labs Reviewed  BASIC METABOLIC PANEL - Abnormal; Notable for the following:       Result Value   Sodium 132 (*)    Potassium 3.4 (*)    Chloride  98 (*)    All other components within normal limits  CBC  I-STAT TROPONIN, ED    EKG  EKG Interpretation  Date/Time:  Sunday October 24 2016 18:45:07 EDT Ventricular Rate:  93 PR Interval:  156 QRS Duration: 74 QT Interval:  322 QTC Calculation: 400 R Axis:   78 Text Interpretation:  Normal sinus rhythm Nonspecific T wave abnormality Abnormal ECG since last tracing no significant change Confirmed by Rolan Bucco [phone] on 10/24/2016 7:56:43 PM       Radiology Dg Chest 2  View  Result Date: 10/24/2016 CLINICAL DATA:  Upper back pain for 1 hour EXAM: CHEST  2 VIEW COMPARISON:  09/09/2015 FINDINGS: No pleural effusion or focal consolidation. Moderate to large left pneumothorax with approximately 5 cm pleural-parenchymal separation at the apex with pneumothorax visualized laterally, medially and at the left CP angle. No midline shift. Normal heart size. IMPRESSION: Moderate left pneumothorax with 5 cm pleural-parenchymal separation at the apex. No midline shift. Critical Value/emergent results were called by telephone at the time of interpretation on 10/24/2016 at 7:23 pm to Dr. Shawna Orleans Jawara Latorre , who verbally acknowledged these results. Electronically Signed   By: Jasmine Pang M.D.   On: 10/24/2016 19:23   Dg Chest 1v Repeat Same Day  Result Date: 10/24/2016 CLINICAL DATA:  Follow-up pneumothorax following chest tube placement EXAM: CHEST - 1 VIEW SAME DAY COMPARISON:  Film from earlier in the same day FINDINGS: Left chest tube is now noted in place. There is been near complete resolution of left pneumothorax. Only a tiny apical component remains. Cardiac shadow is stable. No other focal abnormality is seen. IMPRESSION: Near complete resolution of left pneumothorax following chest tube placement. Electronically Signed   By: Alcide Clever M.D.   On: 10/24/2016 21:17    Procedures Procedures (including critical care time)  Medications Ordered in ED Medications  acetaminophen (TYLENOL) tablet 650 mg (not administered)  oxyCODONE (Oxy IR/ROXICODONE) immediate release tablet 10 mg (not administered)  morphine 4 MG/ML injection 2 mg (not administered)  docusate sodium (COLACE) capsule 200 mg (not administered)  midazolam (VERSED) injection 2 mg (2 mg Intravenous Given 10/24/16 2116)  morphine 4 MG/ML injection 2 mg (2 mg Intravenous Given 10/24/16 2117)  midazolam (VERSED) injection (2 mg Intravenous Given 10/24/16 2056)  morphine injection (2 mg Intravenous Given 10/24/16 2055)   0.9 %  sodium chloride infusion (1,000 mLs Intravenous New Bag/Given 10/24/16 2056)     Initial Impression / Assessment and Plan / ED Course  I have reviewed the triage vital signs and the nursing notes.  Pertinent labs & imaging results that were available during my care of the patient were reviewed by me and considered in my medical decision making (see chart for details).     PT with mod/large spontaneous pneumothorax.  Pt without hypoxia.  No increased WOB, talking in full sentences.  Spoke with Dr. Cornelius Moras who will place chest tube and admit pt.  Final Clinical Impressions(s) / ED Diagnoses   Final diagnoses:  Spontaneous pneumothorax    New Prescriptions There are no discharge medications for this patient.    Rolan Bucco, MD 10/24/16 2239

## 2016-10-24 NOTE — Op Note (Signed)
CARDIOTHORACIC SURGERY OPERATIVE NOTE  Date of Procedure:  10/24/2016  Preoperative Diagnosis: Left Primary Spontaneous Pneumothorax  Postoperative Diagnosis: Same  Procedure:   Left chest tube placement  Surgeon:   Salvatore Decent. Cornelius Moras, MD  Anesthesia: 1% lidocaine local with intravenous sedation    DETAILS OF THE OPERATIVE PROCEDURE  Following full informed consent the patient was given midazolam 2 mg and morphine 2 mg intravenously and continuously monitored for rhythm, BP and oxygen saturation. The left chest was prepared and draped in a sterile manner. 1% lidocaine was utilized to anesthetize the skin and subcutaneous tissues. A small incision was made and a 28 French straight chest tube was placed through the incision into the pleural space. The tube was secured to the skin and connected to a closed suction collection device. The patient tolerated the procedure well. A portable CXR was ordered. There were no complications.    Salvatore Decent. Cornelius Moras, MD 10/24/2016 9:08 PM

## 2016-10-24 NOTE — H&P (Signed)
[address]       [place] [address]             [phone]          CARDIOTHORACIC SURGERY HISTORY AND PHYSICAL EXAM  PCP is Patient, No Pcp Per Referring Provider is Rolan Bucco, MD  Reason for consultation:  Spontaneous pneumothorax  HPI:  Patient is a previously healthy 21 year old male who developed sudden onset left shoulder and chest pain this afternoon. Pain was not preceded by any sort of traumatic injury. Patient has never experienced any similar pain in the past. Patient has no previous history of spontaneous pneumothorax. Chest x-ray performed in the emergency department demonstrates moderate to large size left pneumothorax with getting features.  History reviewed. No pertinent past medical history.  History reviewed. No pertinent surgical history.  History reviewed. No pertinent family history.  Social History   Social History  . Marital status: Single    Spouse name: N/A  . Number of children: N/A  . Years of education: N/A   Occupational History  . Not on file.   Social History Main Topics  . Smoking status: Former Smoker    Types: Cigars  . Smokeless tobacco: Never Used  . Alcohol use No  . Drug use: No  . Sexual activity: Yes   Other Topics Concern  . Not on file   Social History Narrative  . No narrative on file    Prior to Admission medications   Medication Sig Start Date End Date Taking? Authorizing Provider  HYDROcodone-acetaminophen (HYCET) 7.5-325 mg/15 ml solution Take 15 mLs by mouth every 8 (eight) hours as needed for moderate pain. Patient not taking: Reported on 08/20/2016 03/15/16   Barrett Henle, PA-C  ibuprofen (ADVIL,MOTRIN) 600 MG tablet Take 1 tablet (600 mg total) by mouth every 6 (six) hours as needed. Patient not taking: Reported on 08/20/2016 03/15/16   Barrett Henle, PA-C  ibuprofen (ADVIL,MOTRIN) 800 MG tablet Take 1 tablet (800 mg total) by mouth 3 (three) times  daily. Patient not taking: Reported on 08/20/2016 04/11/16   Anselm Pancoast, PA-C    Current Facility-Administered Medications  Medication Dose Route Frequency Provider Last Rate Last Dose  . lidocaine (PF) (XYLOCAINE) 1 % injection 30 mL  30 mL Infiltration Once Rolan Bucco, MD       Current Outpatient Prescriptions  Medication Sig Dispense Refill  . HYDROcodone-acetaminophen (HYCET) 7.5-325 mg/15 ml solution Take 15 mLs by mouth every 8 (eight) hours as needed for moderate pain. (Patient not taking: Reported on 08/20/2016) 120 mL 0  . ibuprofen (ADVIL,MOTRIN) 600 MG tablet Take 1 tablet (600 mg total) by mouth every 6 (six) hours as needed. (Patient not taking: Reported on 08/20/2016) 30 tablet 0  . ibuprofen (ADVIL,MOTRIN) 800 MG tablet Take 1 tablet (800 mg total) by mouth 3 (three) times daily. (Patient not taking: Reported on 08/20/2016) 21 tablet 0    No Known Allergies    Review of Systems:   General:  normal appetite, normal energy, no weight gain, no weight loss, no fever  Cardiac:  no chest pain with exertion, + chest pain at rest, + SOB with exertion, + resting SOB, no PND, no orthopnea, no palpitations, no arrhythmia, no atrial fibrillation, no LE edema, no dizzy spells, no syncope  Respiratory:  + shortness of breath, no home oxygen, no productive cough, no dry cough, no bronchitis, no wheezing, no hemoptysis, no asthma, + pain with inspiration  or cough, no sleep apnea, no CPAP at night  GI:   no difficulty swallowing, no reflux, no frequent heartburn, no hiatal hernia, no abdominal pain, no constipation, no diarrhea, no hematochezia, no hematemesis, no melena  GU:   no dysuria,  no frequency, no urinary tract infection, no hematuria, no enlarged prostate, no kidney stones, no kidney disease  Vascular:  no pain suggestive of claudication, no pain in feet, no leg cramps, no varicose veins, no DVT, no non-healing foot ulcer  Neuro:   no stroke, no TIA's, no seizures, no headaches,  no temporary blindness one eye,  no slurred speech, no peripheral neuropathy, no chronic pain, no instability of gait, no memory/cognitive dysfunction  Musculoskeletal: no arthritis, no joint swelling, no myalgias, no difficulty walking, normal mobility   Skin:   no rash, no itching, no skin infections, no pressure sores or ulcerations  Psych:   no anxiety, no depression, no nervousness, no unusual recent stress  Eyes:   no blurry vision, no floaters, no recent vision changes, does not wears glasses or contacts  ENT:   no hearing loss, no loose or painful teeth  Hematologic:  no easy bruising, no abnormal bleeding, no clotting disorder, no frequent epistaxis  Endocrine:  no diabetes, does not check CBG's at home     Physical Exam:   BP 103/69   Pulse 80   Temp 98.3 F (36.8 C) (Oral)   Resp 16   SpO2 97%   General:  Thin,  well-appearing  HEENT:  Unremarkable   Neck:   no JVD, no bruits, no adenopathy   Chest:   clear to auscultation, diminished breath sounds on left side, no wheezes, no rhonchi   CV:   RRR, no  murmur   Abdomen:  soft, non-tender, no masses   Extremities:  warm, well-perfused, pulses palpable, no lower extremity edema  Rectal/GU  Deferred  Neuro:   Grossly non-focal and symmetrical throughout  Skin:   Clean and dry, no rashes, no breakdown  Diagnostic Tests:  CHEST  2 VIEW  COMPARISON:  09/09/2015  FINDINGS: No pleural effusion or focal consolidation. Moderate to large left pneumothorax with approximately 5 cm pleural-parenchymal separation at the apex with pneumothorax visualized laterally, medially and at the left CP angle. No midline shift. Normal heart size.  IMPRESSION: Moderate left pneumothorax with 5 cm pleural-parenchymal separation at the apex. No midline shift.  Critical Value/emergent results were called by telephone at the time of interpretation on 10/24/2016 at 7:23 pm to Dr. Shawna Orleans BELFI , who verbally acknowledged these  results.   Electronically Signed   By: Jasmine Pang M.D.   On: 10/24/2016 19:23   Impression:  Left primary spontaneous pneumothorax   Plan:  Chest tube placement and hospital admission  I spent in excess of 60 minutes during the conduct of this hospital consultation and >50% of this time involved direct face-to-face encounter for counseling and/or coordination of the patient's care.   Salvatore Decent. Cornelius Moras, MD 10/24/2016 8:33 PM

## 2016-10-25 ENCOUNTER — Inpatient Hospital Stay (HOSPITAL_COMMUNITY): Payer: Self-pay

## 2016-10-25 MED ORDER — ENOXAPARIN SODIUM 30 MG/0.3ML ~~LOC~~ SOLN
30.0000 mg | SUBCUTANEOUS | Status: DC
  Administered 2016-10-25 – 2016-10-27 (×3): 30 mg via SUBCUTANEOUS
  Filled 2016-10-25 (×3): qty 0.3

## 2016-10-25 MED ORDER — KETOROLAC TROMETHAMINE 30 MG/ML IJ SOLN
30.0000 mg | Freq: Four times a day (QID) | INTRAMUSCULAR | Status: AC
  Administered 2016-10-25 (×2): 30 mg via INTRAVENOUS
  Filled 2016-10-25 (×3): qty 1

## 2016-10-25 NOTE — Progress Notes (Addendum)
      [address]       [place] [address]             [phone]           Subjective: Patient with chest tube pain this am.  Objective: Vital signs in last 24 hours: Temp:  [97.1 F (36.2 C)-98.6 F (37 C)] 98.6 F (37 C) (08/27 0447) Pulse Rate:  [45-121] 99 (08/27 0100) Cardiac Rhythm: Normal sinus rhythm (08/26 2335) Resp:  [13-23] 17 (08/27 0100) BP: (102-120)/(54-89) 102/54 (08/27 0447) SpO2:  [77 %-100 %] 100 % (08/27 0100) Weight:  [122 lb 14.4 oz (55.7 kg)] 122 lb 14.4 oz (55.7 kg) (08/26 2232)     Intake/Output from previous day: No intake/output data recorded.   Physical Exam:  Cardiovascular: RRR Pulmonary: Clear to auscultation bilaterally Wound: Dressing is clean and dry.   Chest Tube: to suction, no air leak  Lab Results: CBC: Recent Labs  10/24/16 1846  WBC 8.2  HGB 13.8  HCT 41.5  PLT 290   BMET:  Recent Labs  10/24/16 1846  NA 132*  K 3.4*  CL 98*  CO2 24  GLUCOSE 86  BUN 7  CREATININE 0.84  CALCIUM 9.5    PT/INR: No results for input(s): LABPROT, INR in the last 72 hours. ABG:  INR: Will add last result for INR, ABG once components are confirmed Will add last 4 CBG results once components are confirmed  Assessment/Plan:  1. CV - SR in the 90's this am. 2.  Pulmonary - Chest tube to suction. There is no air leak. CXR this am shows small left pneumothorax. Chest tube just put in last evening so will leave to suction for now. Check CXR in am. 3. Toradol for 3 doses to help with pain  ZIMMERMAN,DONIELLE MPA-C 10/25/2016,7:39 AM  I have seen and examined the patient and agree with the assessment and plan as outlined.  I spent in excess of 15 minutes during the conduct of this hospital encounter and >50% of this time involved direct face-to-face encounter with the patient for counseling and/or coordination of their care.  Purcell Nails, MD 10/25/2016 8:48 AM

## 2016-10-26 ENCOUNTER — Inpatient Hospital Stay (HOSPITAL_COMMUNITY): Payer: Self-pay

## 2016-10-26 NOTE — Progress Notes (Addendum)
      [address]       [place] [address]             [phone]           Subjective: Patient states Toradol helped with pain yesterday.  Objective: Vital signs in last 24 hours: Temp:  [98.1 F (36.7 C)-98.9 F (37.2 C)] 98.5 F (36.9 C) (08/28 0633) Pulse Rate:  [78-87] 87 (08/28 0634) Cardiac Rhythm: Normal sinus rhythm (08/28 0716) Resp:  [13-16] 13 (08/28 0634) BP: (97-110)/(58-68) 107/63 (08/28 0634) SpO2:  [99 %-100 %] 100 % (08/28 0634)     Intake/Output from previous day: 08/27 0701 - 08/28 0700 In: 600 [P.O.:600] Out: 1550 [Urine:1550]   Physical Exam:  Cardiovascular: RRR Pulmonary: Clear to auscultation bilaterally Wound: Dressing is clean and dry.   Chest Tube: to suction, no air leak  Lab Results: CBC:  Recent Labs  10/24/16 1846  WBC 8.2  HGB 13.8  HCT 41.5  PLT 290   BMET:   Recent Labs  10/24/16 1846  NA 132*  K 3.4*  CL 98*  CO2 24  GLUCOSE 86  BUN 7  CREATININE 0.84  CALCIUM 9.5    PT/INR: No results for input(s): LABPROT, INR in the last 72 hours. ABG:  INR: Will add last result for INR, ABG once components are confirmed Will add last 4 CBG results once components are confirmed  Assessment/Plan:  1. CV - SR in the 90's this am. 2.  Pulmonary - Chest tube to suction. There is no air leak. CXR appears stable (small left apical ptx) this am. Will place chest tube to water seal. Check CXR in am.   ZIMMERMAN,DONIELLE MPA-C 10/26/2016,7:32 AM  I have seen and examined the patient and agree with the assessment and plan as outlined.  Chest tube to water seal.  D/C tube tomorrow if CXR stable then D/C home tomorrow afternoon if f/u CXR stable after tube removed.  Purcell Nails, MD 10/26/2016 11:11 AM

## 2016-10-26 NOTE — Discharge Summary (Signed)
Physician Discharge Summary       [address]       [place] [address]             [phone]    Patient ID: Daniel Higgins MRN: [id number] DOB/AGE: 04-01-1995 21 y.o.  Admit date: 10/24/2016 Discharge date: 10/27/2016  Admission Diagnoses: Left spontaneous pneumothorax  Active Diagnoses:  Cigar abuse  Procedure (s):  Left chest tube placement by Dr. Cornelius Moras on 10/24/2016.  History of Presenting Illness: Patient is a previously healthy 21 year old male who developed sudden onset left shoulder and chest pain this afternoon. Pain was not preceded by any sort of traumatic injury. Patient has never experienced any similar pain in the past. Patient has no previous history of spontaneous pneumothorax. Chest x-ray performed in the emergency department demonstrates moderate to large size left pneumothorax with getting features. Dr. Cornelius Moras placed a 28 French chest tube. Follow up chest x ray showed near complete resolution of left pneumothorax.  Brief Hospital Course:  He has remained afebrile and hemodynamically stable. Left chest tube showed no air leak. Chest tube was placed to water seal on 10/26/2016. Follow up chest x ray showed small,stable left apical pneumohtorax. Chest tube was removed on 10/27/2016. Chest x ray done after chest tube removal showed stable, left apical pneumothorax. He is ambulating on room air. He is felt surgically stable for discharge today.    Latest Vital Signs: Blood pressure 100/61, pulse 73, temperature 99.4 F (37.4 C), temperature source Oral, resp. rate 15, height 5\' 9"  (1.753 m), weight 122 lb 14.4 oz (55.7 kg), SpO2 98 %.  Physical Exam: Cardiovascular: RRR Pulmonary: Clear to auscultation bilaterally Wound: Dressing is clean and dry.     Discharge Condition:Stable and discharged to home.  Recent laboratory studies:  Lab Results  Component Value Date   WBC 8.2 10/24/2016   HGB 13.8 10/24/2016   HCT 41.5 10/24/2016   MCV 81.4  10/24/2016   PLT 290 10/24/2016   Lab Results  Component Value Date   NA 132 (L) 10/24/2016   K 3.4 (L) 10/24/2016   CL 98 (L) 10/24/2016   CO2 24 10/24/2016   CREATININE 0.84 10/24/2016   GLUCOSE 86 10/24/2016   Diagnostic Studies:  CLINICAL DATA:  Removal of left chest tube. Evaluate left-sided pneumothorax.  EXAM: CHEST  2 VIEW  COMPARISON:  Portable chest x-ray of today's date  FINDINGS: There remains a less than 5% apical pneumothorax on the left since chest tube removal. There is no pleural effusion. There is no mediastinal shift. There are no infiltrates. The right lung is well-expanded. The heart and mediastinal structures are normal. The bony structures are unremarkable.  IMPRESSION: Persistent 5% or less left apical pneumothorax since chest tube removal. No other cardiopulmonary abnormality.   Electronically Signed   By: David  Swaziland M.D.   On: 10/27/2016 14:23   Discharge Medications: Allergies as of 10/27/2016   No Known Allergies     Medication List    TAKE these medications   oxyCODONE 5 MG immediate release tablet Commonly known as:  Oxy IR/ROXICODONE Take 5 mg by mouth every 4-6 hours PRN severe pain            Discharge Care Instructions        Start     Ordered   10/27/16 0000  oxyCODONE (OXY IR/ROXICODONE) 5 MG immediate release tablet    Question:  Supervising Provider  Answer:  Purcell Nails   10/27/16 0804  Follow Up Appointments: Follow-up Information    Purcell Nails, MD Follow up on 11/02/2016.   Specialty:  Cardiothoracic Surgery Why:  PA/LAT CXR to be taken (at [facility] which is in the same building as Dr. Orvan July office, on the ground floor) on 11/02/2016 at 1:45 pm;Appointment time is at 2:15 pm and is with physician assistant Contact information: [address] [place] [address] [phone]           Signed: Doree Fudge MPA-C 10/27/2016, 2:18 PM

## 2016-10-26 NOTE — Discharge Instructions (Signed)

## 2016-10-27 ENCOUNTER — Inpatient Hospital Stay (HOSPITAL_COMMUNITY): Payer: Self-pay

## 2016-10-27 MED ORDER — OXYCODONE HCL 5 MG PO TABS
ORAL_TABLET | ORAL | 0 refills | Status: DC
Start: 2016-10-27 — End: 2018-03-11

## 2016-10-27 NOTE — Progress Notes (Addendum)
      [address]       [place] [id number]             [phone]           Subjective: Patient has some incisional pain at left chest tube site this am.  Objective: Vital signs in last 24 hours: Temp:  [98.3 F (36.8 C)-98.6 F (37 C)] 98.5 F (36.9 C) (08/29 0509) Pulse Rate:  [80-89] 89 (08/28 1948) Cardiac Rhythm: Normal sinus rhythm (08/29 0720) Resp:  [17-18] 18 (08/28 1948) BP: (102-110)/(61-73) 110/73 (08/29 0509) SpO2:  [100 %] 100 % (08/29 0509)     Intake/Output from previous day: 08/28 0701 - 08/29 0700 In: -  Out: 1234 [Urine:1200; Chest Tube:34]   Physical Exam:  Cardiovascular: RRR Pulmonary: Clear to auscultation bilaterally Wound: Dressing is clean and dry.   Chest Tube: to water seal, no air leak  Lab Results: CBC:  Recent Labs  10/24/16 1846  WBC 8.2  HGB 13.8  HCT 41.5  PLT 290   BMET:   Recent Labs  10/24/16 1846  NA 132*  K 3.4*  CL 98*  CO2 24  GLUCOSE 86  BUN 7  CREATININE 0.84  CALCIUM 9.5    PT/INR: No results for input(s): LABPROT, INR in the last 72 hours. ABG:  INR: Will add last result for INR, ABG once components are confirmed Will add last 4 CBG results once components are confirmed  Assessment/Plan:  1. CV - SR in the 90's this am. 2.  Pulmonary - Chest tube to water seal. There is NO air leak. CXR appears stable (small left apical ptx) this am. Will remove chest tube this am. Check CXR this afternoon. 3. If CXR is stable after chest tube removal, will discharge  ZIMMERMAN,DONIELLE MPA-C 10/27/2016,7:53 AM  I have seen and examined the patient and agree with the assessment and plan as outlined.  Patient advised no driving and no strenuous activity of any kind for at least 2 weeks.  Purcell Nailslarence H Owen, MD 10/27/2016 8:31 AM

## 2016-10-27 NOTE — Progress Notes (Signed)
Chest tube removed, pt tolerated well, will continue to monitor.

## 2016-10-27 NOTE — Progress Notes (Signed)
Pt discharged home with girlfriend and sister. AVS reviewed in full with patient and he verbalized understanding. Script given to patient. Pt aware per PA to avoid strenuous activity and driving for at least 2 weeks. Work note sent with patient per request by PA. VSS. BP 104/64 (BP Location: Left Arm)   Pulse 81   Temp 98.9 F (37.2 C) (Oral)   Resp 15   Ht 5\' 9"  (1.753 m)   Wt 55.7 kg (122 lb 14.4 oz)   SpO2 99%   BMI 18.15 kg/m

## 2016-10-27 NOTE — Care Management Note (Signed)
Case Management Note Donn Pierini RN, BSN Unit 4E-Case Manager [phone]  Patient Details  Name: Daniel Higgins MRN: [id number] Date of Birth: [birth year]  Subjective/Objective:  Pt admitted with spont. pntx- chest tube placed.                   Action/Plan: PTA pt lived at home- independent- plan to return home- No CM needs noted for discharge.   Expected Discharge Date:  10/27/16               Expected Discharge Plan:  Home/Self Care  In-House Referral:  NA  Discharge planning Services  CM Consult  Post Acute Care Choice:  NA Choice offered to:  NA  DME Arranged:    DME Agency:     HH Arranged:    HH Agency:     Status of Service:  Completed, signed off  If discussed at Long Length of Stay Meetings, dates discussed:    Discharge Disposition: home/self care   Additional Comments:  Darrold Span, RN 10/27/2016, 2:50 PM

## 2016-10-29 ENCOUNTER — Other Ambulatory Visit: Payer: Self-pay | Admitting: Thoracic Surgery (Cardiothoracic Vascular Surgery)

## 2016-10-29 DIAGNOSIS — J9311 Primary spontaneous pneumothorax: Secondary | ICD-10-CM

## 2016-11-02 ENCOUNTER — Ambulatory Visit (INDEPENDENT_AMBULATORY_CARE_PROVIDER_SITE_OTHER): Payer: Self-pay | Admitting: Physician Assistant

## 2016-11-02 ENCOUNTER — Encounter: Payer: Self-pay | Admitting: Physician Assistant

## 2016-11-02 ENCOUNTER — Ambulatory Visit
Admission: RE | Admit: 2016-11-02 | Discharge: 2016-11-02 | Disposition: A | Discharge status: Home / Self Care | Payer: Self-pay | Source: Ambulatory Visit | Attending: Thoracic Surgery (Cardiothoracic Vascular Surgery) | Admitting: Thoracic Surgery (Cardiothoracic Vascular Surgery)

## 2016-11-02 VITALS — BP 107/74 | HR 76 | Resp 20 | Ht 69.0 in | Wt 125.0 lb

## 2016-11-02 DIAGNOSIS — J9311 Primary spontaneous pneumothorax: Secondary | ICD-10-CM

## 2016-11-02 DIAGNOSIS — Z9889 Other specified postprocedural states: Secondary | ICD-10-CM

## 2016-11-02 DIAGNOSIS — J939 Pneumothorax, unspecified: Secondary | ICD-10-CM

## 2016-11-02 NOTE — Progress Notes (Signed)
HPI: Patient returns for routine follow up S/P chest tube placement for spontaneous pneumothorax.  He presented to [facility]/26 and was discharged home on 8/29 with a stable minimal apical pneumothorax.  Currently the patient states he is doing well for the most part.  He does have some continued mild pain along his left side.  He also states he has had some bleeding from his chest tube site.  He does not smoke.    Current Outpatient Prescriptions  Medication Sig Dispense Refill  . oxyCODONE (OXY IR/ROXICODONE) 5 MG immediate release tablet Take 5 mg by mouth every 4-6 hours PRN severe pain 10 tablet 0   No current facility-administered medications for this visit.     Physical Exam:  BP 107/74   Pulse 76   Resp 20   Ht 5\' 9"  (1.753 m)   Wt 125 lb (56.7 kg)   SpO2 99% Comment: RA  BMI 18.46 kg/m   Gen: no apparent distress Heart RRR Lungs: CTA Incision: suture remains in place, pulling through skin  Diagnostic Tests:  CXR: resolution of left apical pneumothorax  A/P:  1. Resolution of previous left apical pneumothorax 2. Pain control- instructed patient to utilize Ibuprofen, Tylenol prn for pain 3. Dispo- RTC prn.. Patient educated on signs and symptoms of recurrent pneumothorax and instructed to contact office or report to ED should these occur  Lowella DandyBARRETT, Avrianna Smart, PA-C [facility] [phone]

## 2016-11-04 ENCOUNTER — Emergency Department (HOSPITAL_COMMUNITY)
Admission: EM | Admit: 2016-11-04 | Discharge: 2016-11-04 | Disposition: A | Discharge status: Home / Self Care | Payer: Self-pay | Attending: Emergency Medicine | Admitting: Emergency Medicine

## 2016-11-04 ENCOUNTER — Emergency Department (HOSPITAL_COMMUNITY): Payer: Self-pay

## 2016-11-04 ENCOUNTER — Encounter (HOSPITAL_COMMUNITY): Payer: Self-pay | Admitting: Emergency Medicine

## 2016-11-04 DIAGNOSIS — R0789 Other chest pain: Secondary | ICD-10-CM | POA: Insufficient documentation

## 2016-11-04 DIAGNOSIS — R0602 Shortness of breath: Secondary | ICD-10-CM | POA: Insufficient documentation

## 2016-11-04 DIAGNOSIS — Z87891 Personal history of nicotine dependence: Secondary | ICD-10-CM | POA: Insufficient documentation

## 2016-11-04 MED ORDER — KETOROLAC TROMETHAMINE 15 MG/ML IJ SOLN
15.0000 mg | Freq: Once | INTRAMUSCULAR | Status: AC
  Administered 2016-11-04: 15 mg via INTRAMUSCULAR
  Filled 2016-11-04: qty 1

## 2016-11-04 NOTE — ED Triage Notes (Signed)
Pt reports he was treated for a collapsed L lung several days ago. Had chest tube removed on 8/30. Since then pain on posterior L back that has gradually become worse accompanied by some mild SOB.

## 2016-11-04 NOTE — ED Provider Notes (Signed)
[facility]-EMERGENCY DEPT Provider Note   CSN: [id number] Arrival date & time: 11/04/16  1006     History   Chief Complaint Chief Complaint  Patient presents with  . Chest Pain    HPI Daniel Higgins is a 21 y.o. male.  HPI   21 year old male with chest pain. Recent admission a chest tube for left spontaneous pneumothorax. Is complaining continued chest pain particularly around the incision site. Denies any acute worsening but says this really has not improved the way expectorated 2. No fevers or chills. No significant cough. Unusual leg pain or swelling.  History reviewed. No pertinent past medical history.  Patient Active Problem List   Diagnosis Date Noted  . 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 Primary spontaneous pneumothorax 10/24/2016    History reviewed. No pertinent surgical history.     Home Medications    Prior to Admission medications   Medication Sig Start Date End Date Taking? Authorizing Provider  oxyCODONE (OXY IR/ROXICODONE) 5 MG immediate release tablet Take 5 mg by mouth every 4-6 hours PRN severe pain 10/27/16   Ardelle BallsZimmerman, Donielle M, PA-C    Family History History reviewed. No pertinent family history.  Social History Social History  Substance Use Topics  . Smoking status: Former Smoker    Types: Cigars  . Smokeless tobacco: Never Used  . Alcohol use No     Allergies   Patient has no known allergies.   Review of Systems Review of Systems  All systems reviewed and negative, other than as noted in HPI.  Physical Exam Updated Vital Signs BP 102/60   Pulse 81   Temp 98.5 F (36.9 C)   Resp 17   SpO2 98%   Physical Exam  Constitutional: He appears well-developed and well-nourished. No distress.  HENT:  Head: Normocephalic and atraumatic.  Eyes: Conjunctivae are normal. Right eye exhibits no discharge. Left eye exhibits no discharge.  Neck: Neck supple.  Cardiovascular: Normal rate, regular rhythm and normal heart  sounds.  Exam reveals no gallop and no friction rub.   No murmur heard. Pulmonary/Chest: Effort normal and breath sounds normal. No respiratory distress.  Incision appears to be healing well.  Abdominal: Soft. He exhibits no distension. There is no tenderness.  Musculoskeletal: He exhibits no edema or tenderness.  Neurological: He is alert.  Skin: Skin is warm and dry.  Psychiatric: He has a normal mood and affect. His behavior is normal. Thought content normal.  Nursing note and vitals reviewed.    ED Treatments / Results  Labs (all labs ordered are listed, but only abnormal results are displayed) Labs Reviewed - No data to display  EKG  EKG Interpretation  Date/Time:  Thursday November 04 2016 10:25:05 EDT Ventricular Rate:  92 PR Interval:    QRS Duration: 97 QT Interval:  326 QTC Calculation: 404 R Axis:   81 Text Interpretation:  Sinus rhythm Confirmed by Raeford RazorKohut, Tiffanni Scarfo [phone]([id number]) on 11/04/2016 10:31:40 AM       Radiology Dg Chest 2 View  Result Date: 11/04/2016 CLINICAL DATA:  Left posterior chest pain. Shortness of breath. Had a chest tube 7 days ago for collapsed lung. EXAM: CHEST  2 VIEW COMPARISON:  11/02/2016 FINDINGS: There is no edema, consolidation, effusion, or pneumothorax. Normal heart size and mediastinal contours. No osseous findings. IMPRESSION: Negative chest.  No evidence of pneumothorax recurrence. Electronically Signed   By: Marnee SpringJonathon  Watts M.D.   On: 11/04/2016 10:55   Dg Chest 2 View  Result Date: 11/02/2016  CLINICAL DATA:  Left-sided chest pain. Recent pneumothorax. Follow-up study. EXAM: CHEST  2 VIEW COMPARISON:  PA and lateral chest x-ray of October 27, 2016 FINDINGS: The tiny left apical pneumothorax is no longer evident. There is no right-sided pneumothorax. There is no pleural effusion or alveolar infiltrate. The heart and pulmonary vascularity are normal. The mediastinum is normal in width. The bony thorax is unremarkable. IMPRESSION: There is no  acute cardiopulmonary abnormality. The tiny left apical pneumothorax has resolved. Electronically Signed   By: David  Swaziland M.D.   On: 11/02/2016 15:31    Procedures Procedures (including critical care time)  Medications Ordered in ED Medications  ketorolac (TORADOL) 15 MG/ML injection 15 mg (not administered)     Initial Impression / Assessment and Plan / ED Course  I have reviewed the triage vital signs and the nursing notes.  Pertinent labs & imaging results that were available during my care of the patient were reviewed by me and considered in my medical decision making (see chart for details).     21yM with continued L sided CP. Incision appears to be healing well. Lung is up and CXR w/o acute findings. Seen by CTS yesterday in follow-up visit and to return PRN. Continue NSAIDs as needed.    Final Clinical Impressions(s) / ED Diagnoses   Final diagnoses:  Chest wall pain    New Prescriptions New Prescriptions   No medications on file     Raeford Razor, MD 11/21/16 1810

## 2016-11-08 ENCOUNTER — Ambulatory Visit: Payer: Medicaid Other

## 2016-11-09 ENCOUNTER — Other Ambulatory Visit: Payer: Self-pay | Admitting: Thoracic Surgery (Cardiothoracic Vascular Surgery)

## 2016-11-09 ENCOUNTER — Ambulatory Visit
Admission: RE | Admit: 2016-11-09 | Discharge: 2016-11-09 | Disposition: A | Discharge status: Home / Self Care | Payer: No Typology Code available for payment source | Source: Ambulatory Visit | Attending: Thoracic Surgery (Cardiothoracic Vascular Surgery) | Admitting: Thoracic Surgery (Cardiothoracic Vascular Surgery)

## 2016-11-09 DIAGNOSIS — J939 Pneumothorax, unspecified: Secondary | ICD-10-CM

## 2016-12-22 ENCOUNTER — Emergency Department (HOSPITAL_COMMUNITY)
Admission: EM | Admit: 2016-12-22 | Discharge: 2016-12-22 | Discharge status: Against Medical Advice | Payer: Self-pay | Attending: Emergency Medicine | Admitting: Emergency Medicine

## 2016-12-22 DIAGNOSIS — Z5321 Procedure and treatment not carried out due to patient leaving prior to being seen by health care provider: Secondary | ICD-10-CM | POA: Insufficient documentation

## 2016-12-22 NOTE — ED Notes (Signed)
Pt called to triage, no response.

## 2017-04-16 ENCOUNTER — Encounter (HOSPITAL_COMMUNITY): Payer: Self-pay | Admitting: *Deleted

## 2017-04-16 ENCOUNTER — Other Ambulatory Visit: Payer: Self-pay

## 2017-04-16 ENCOUNTER — Emergency Department (HOSPITAL_COMMUNITY)
Admission: EM | Admit: 2017-04-16 | Discharge: 2017-04-16 | Disposition: A | Discharge status: Home / Self Care | Payer: Self-pay | Attending: Emergency Medicine | Admitting: Emergency Medicine

## 2017-04-16 DIAGNOSIS — M25562 Pain in left knee: Secondary | ICD-10-CM

## 2017-04-16 DIAGNOSIS — Z87891 Personal history of nicotine dependence: Secondary | ICD-10-CM | POA: Insufficient documentation

## 2017-04-16 NOTE — ED Provider Notes (Signed)
[facility]-EMERGENCY DEPT Provider Note   CSN: [id number] Arrival date & time: 04/16/17  1305     History   Chief Complaint Chief Complaint  Patient presents with  . Leg Pain    HPI Daniel Higgins is a 22 y.o. male who presents to the ED with left lower leg pain. Patient reports that he had fluid removed from the leg knee months ago and it is still causing hurts once in a while. Patient reports that his left knee was hurting some this morning and he did not go to work. He reports that he has no pain now and needs a work note.  HPI  History reviewed. No pertinent past medical history.  Patient Active Problem List   Diagnosis Date Noted  . 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 Primary spontaneous pneumothorax 10/24/2016    Past Surgical History:  Procedure Laterality Date  . CHEST TUBE INSERTION         Home Medications    Prior to Admission medications   Medication Sig Start Date End Date Taking? Authorizing Provider  oxyCODONE (OXY IR/ROXICODONE) 5 MG immediate release tablet Take 5 mg by mouth every 4-6 hours PRN severe pain 10/27/16   Ardelle BallsZimmerman, Donielle M, PA-C    Family History No family history on file.  Social History Social History   Tobacco Use  . Smoking status: Former Smoker    Types: Cigars  . Smokeless tobacco: Never Used  Substance Use Topics  . Alcohol use: No  . Drug use: No     Allergies   Patient has no known allergies.   Review of Systems Review of Systems  Musculoskeletal: Positive for arthralgias.  All other systems reviewed and are negative.    Physical Exam Updated Vital Signs BP 109/80 (BP Location: Right Arm)   Pulse 79   Temp 97.8 F (36.6 C) (Oral)   Resp 18   Wt 51.7 kg (114 lb)   SpO2 100%   BMI 16.83 kg/m   Physical Exam  Constitutional: He appears well-developed and well-nourished. No distress.  HENT:  Head: Normocephalic.  Eyes: EOM are normal.  Neck: Neck supple.    Cardiovascular: Normal rate.  Pulmonary/Chest: Effort normal.  Musculoskeletal: Normal range of motion. He exhibits no edema or tenderness.       Left knee: He exhibits normal range of motion, no swelling, no ecchymosis, no deformity, no laceration, no erythema and normal alignment. No tenderness found.  Full range of motion of the left knee without pain. No abnormality noted on exam. No calf tenderness, swelling or increased warmth.   Neurological: He is alert.  Skin: Skin is warm and dry.  Psychiatric: He has a normal mood and affect.  Nursing note and vitals reviewed.    ED Treatments / Results  Labs (all labs ordered are listed, but only abnormal results are displayed) Labs Reviewed - No data to display  Radiology No results found.  Procedures Procedures (including critical care time)  Medications Ordered in ED Medications - No data to display   Initial Impression / Assessment and Plan / ED Course  I have reviewed the triage vital signs and the nursing notes. 22 y.o. male with left knee pain that resolved without treatment prior to exam stable for d/c without abnormal findings on exam. Discussed with the patient need for PCP and f/u for general health care.   Final Clinical Impressions(s) / ED Diagnoses   Final diagnoses:  Left knee pain, unspecified  chronicity    ED Discharge Orders    None       Kerrie Buffalo [place], [place] 04/16/17 1635    Raeford Razor, MD 04/16/17 1651

## 2017-04-16 NOTE — ED Triage Notes (Signed)
Pt states he had fluid removed from left lower legs months ago and is now/still having pain.

## 2017-04-16 NOTE — Discharge Instructions (Signed)
Follow up with [facility]. Return here as needed.

## 2017-04-16 NOTE — ED Notes (Signed)
PT DISCHARGED. INSTRUCTIONS GIVEN. AAOX4. PT IN NO APPARENT DISTRESS OR PAIN. THE OPPORTUNITY TO ASK QUESTIONS WAS PROVIDED. 

## 2017-04-17 IMAGING — CR DG CHEST 2V
2 series · 2 of 2 positions shown · non-contrast
Comparison: Prior radiograph from 08/13/2015.

CLINICAL DATA: Initial evaluation for acute chest pain,
palpitations.

EXAM:
CHEST  2 VIEW

[chest pa]
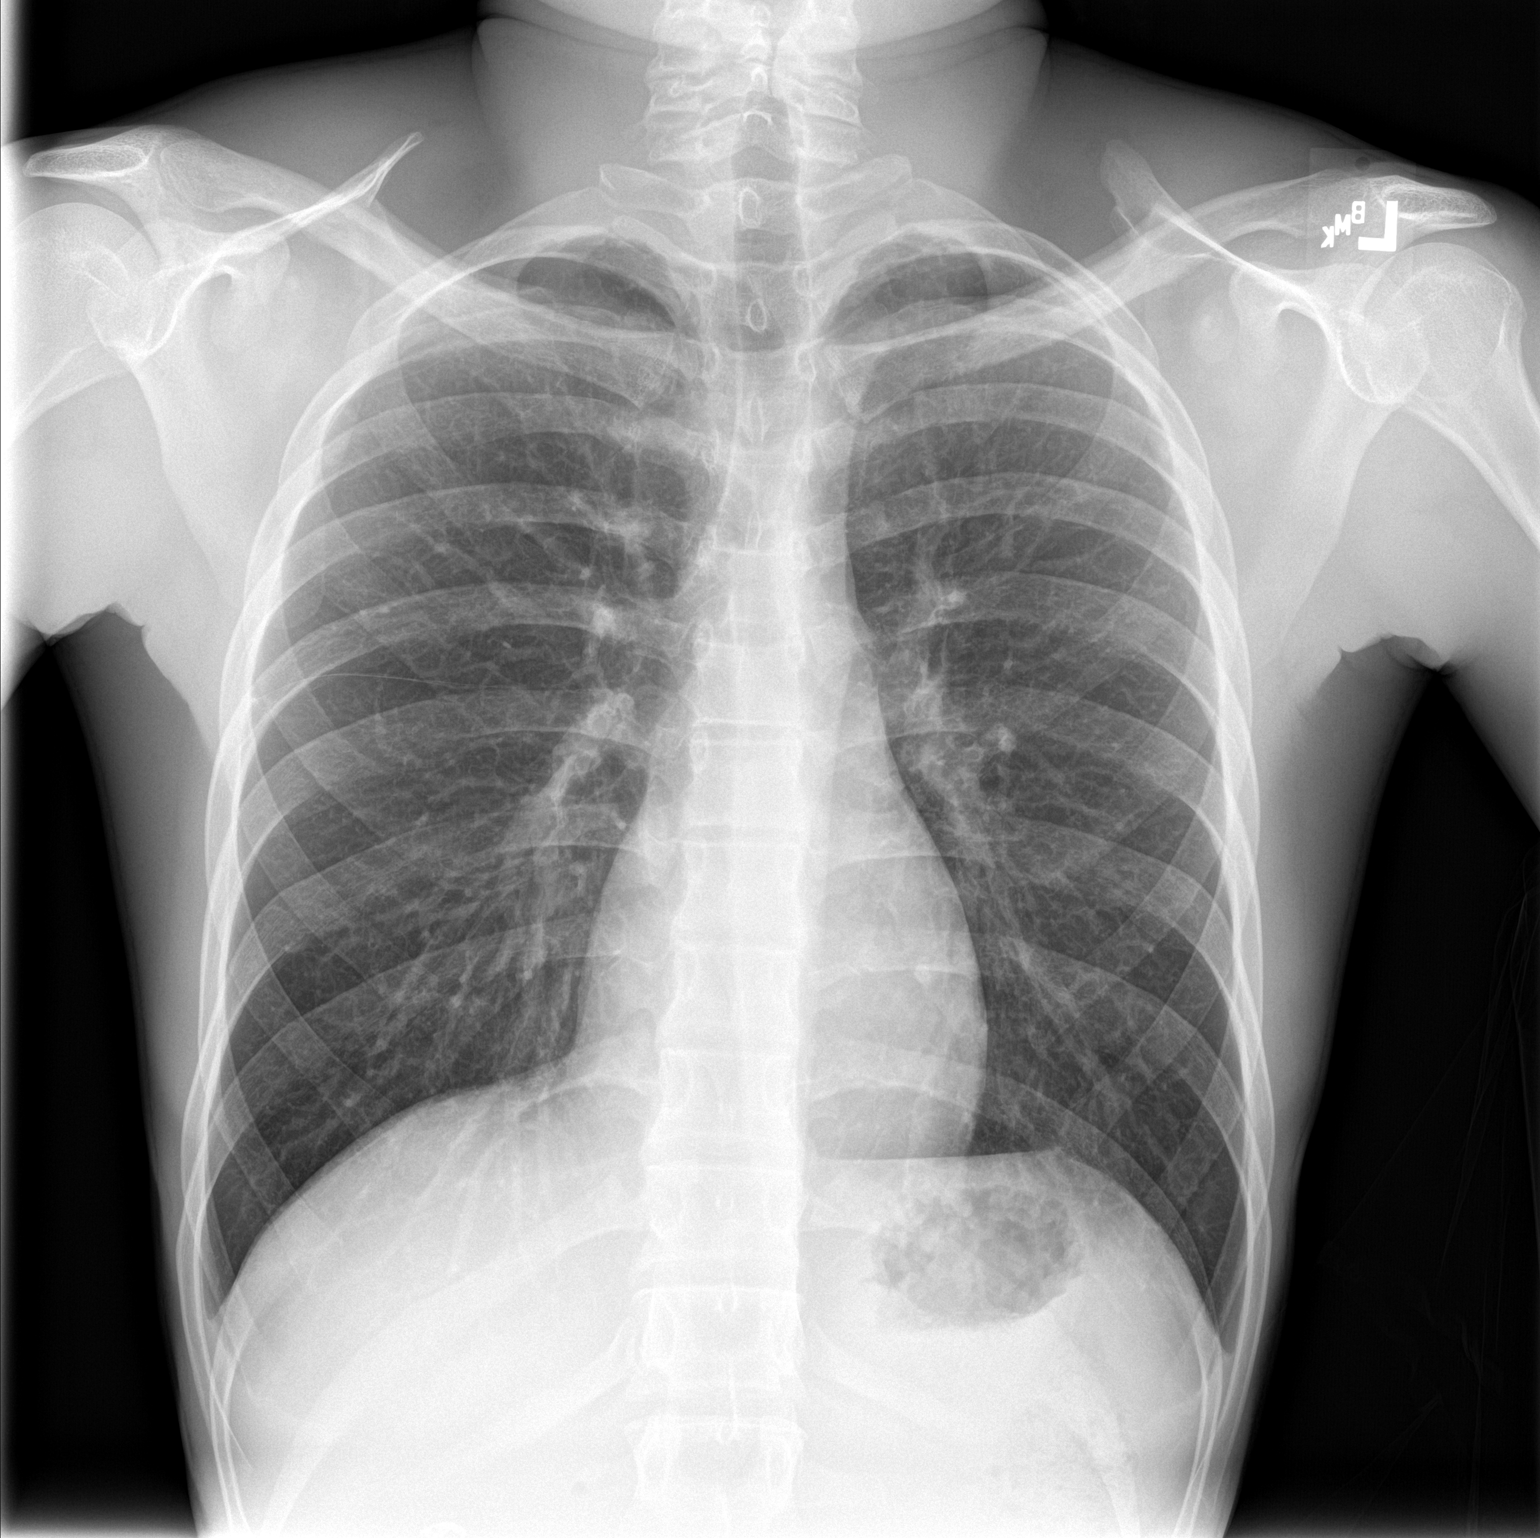

[chest lat]
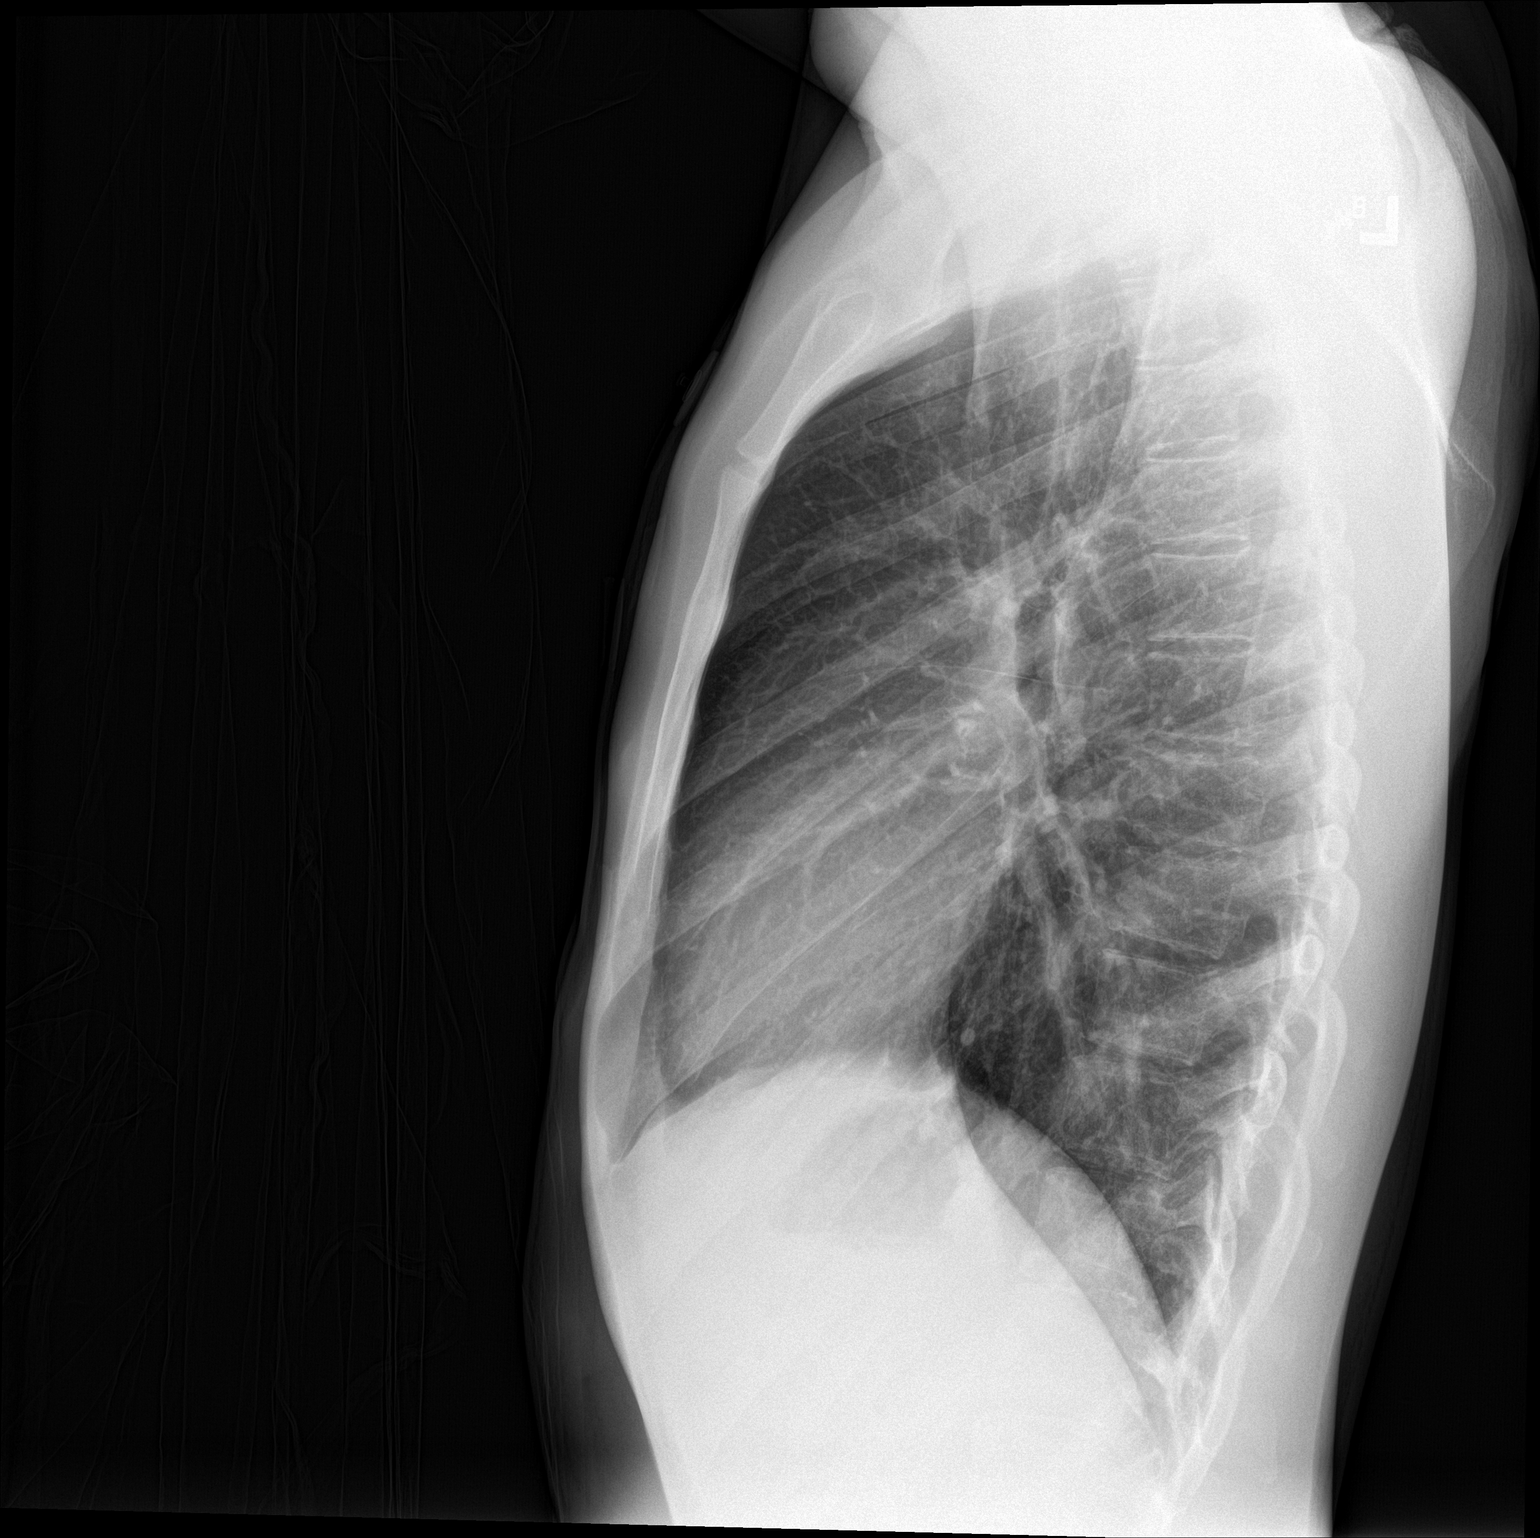

[2 of 2 positions shown; findings below may reference images not displayed]

FINDINGS: The cardiac and mediastinal silhouettes are stable in size and
contour, and remain within normal limits.

The lungs are normally inflated. No airspace consolidation, pleural
effusion, or pulmonary edema is identified. There is no
pneumothorax.

No acute osseous abnormality identified.
IMPRESSION: No active cardiopulmonary disease.

## 2017-05-01 ENCOUNTER — Encounter (HOSPITAL_COMMUNITY): Payer: Self-pay | Admitting: *Deleted

## 2017-05-01 ENCOUNTER — Other Ambulatory Visit: Payer: Self-pay

## 2017-05-01 ENCOUNTER — Emergency Department (HOSPITAL_COMMUNITY)
Admission: EM | Admit: 2017-05-01 | Discharge: 2017-05-01 | Disposition: A | Discharge status: Home / Self Care | Payer: Medicaid Other | Attending: Emergency Medicine | Admitting: Emergency Medicine

## 2017-05-01 DIAGNOSIS — M25562 Pain in left knee: Secondary | ICD-10-CM | POA: Insufficient documentation

## 2017-05-01 DIAGNOSIS — Z87891 Personal history of nicotine dependence: Secondary | ICD-10-CM | POA: Insufficient documentation

## 2017-05-01 MED ORDER — DICLOFENAC SODIUM 50 MG PO TBEC: 50.0000 mg | DELAYED_RELEASE_TABLET | Freq: Two times a day (BID) | ORAL | 0 refills | Status: DC

## 2017-05-01 NOTE — ED Provider Notes (Signed)
[facility] EMERGENCY DEPARTMENT Provider Note   CSN: [id number] Arrival date & time: 05/01/17  1729     History   Chief Complaint Chief Complaint  Patient presents with  . Knee Pain    HPI Daniel Higgins is a 22 y.o. male who presents to the ED for knee pain. Patient was last evaluated by me in the ED at [facility]. At that visit the patient reported that the pain had resolved prior to the evaluation but he did need a work note. Patient has had hx of effusion of the knee and had it drained. Today the patient reports that he has been having pain off and on and is requesting a note to limit his work. Patient reports no pain at this time.   HPI  History reviewed. No pertinent past medical history.  Patient Active Problem List   Diagnosis Date Noted  . 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 Primary spontaneous pneumothorax 10/24/2016    Past Surgical History:  Procedure Laterality Date  . CHEST TUBE INSERTION         Home Medications    Prior to Admission medications   Medication Sig Start Date End Date Taking? Authorizing Provider  diclofenac (VOLTAREN) 50 MG EC tablet Take 1 tablet (50 mg total) by mouth 2 (two) times daily. 05/01/17   Janne Napoleon, NP  oxyCODONE (OXY IR/ROXICODONE) 5 MG immediate release tablet Take 5 mg by mouth every 4-6 hours PRN severe pain 10/27/16   Ardelle Balls, PA-C    Family History History reviewed. No pertinent family history.  Social History Social History   Tobacco Use  . Smoking status: Former Smoker    Types: Cigars  . Smokeless tobacco: Never Used  Substance Use Topics  . Alcohol use: No  . Drug use: No     Allergies   Patient has no known allergies.   Review of Systems Review of Systems  Musculoskeletal: Positive for arthralgias.       Left knee pain.  All other systems reviewed and are negative.    Physical Exam Updated Vital Signs BP 109/69 (BP Location: Right Arm)   Pulse 80    Temp 98.7 F (37.1 C) (Oral)   Resp 17   SpO2 98%   Physical Exam  Constitutional: He appears well-developed and well-nourished. No distress.  HENT:  Head: Normocephalic.  Eyes: EOM are normal.  Neck: Neck supple.  Cardiovascular: Normal rate.  Pulmonary/Chest: Effort normal.  Musculoskeletal: Normal range of motion.       Left knee: He exhibits normal range of motion, no swelling, no ecchymosis, no deformity, no laceration and no erythema. No tenderness found.  Full passive range of motion of the left knee without pain. Pedal pulses 2+, good strength.   Neurological: He is alert.  Skin: Skin is warm and dry.  Nursing note and vitals reviewed.    ED Treatments / Results  Labs (all labs ordered are listed, but only abnormal results are displayed) Labs Reviewed - No data to display Radiology No results found.  Procedures Procedures (including critical care time)  Medications Ordered in ED Medications - No data to display   Initial Impression / Assessment and Plan / ED Course  I have reviewed the triage vital signs and the nursing notes. 22 y.o. male with pain of the left knee that comes and goes. Here today without pain currently, without swelling and has full range of motion without pain. Discussed with the patient  that he will need to f/u with ortho to discuss any limited work duties but tonight without pain or other symptoms I will give a work note stating he was here. I also gave Rx for NSAIDs and knee sleeve for support. Patient stable for d/c without focal neuro deficits.   Final Clinical Impressions(s) / ED Diagnoses   Final diagnoses:  Left knee pain, unspecified chronicity    ED Discharge Orders        Ordered    diclofenac (VOLTAREN) 50 MG EC tablet  2 times daily     05/01/17 1819       Damian Leavelleese, HiawathaHope M, NP 05/01/17 Silva [id number]    Cathren LaineSteinl, Kevin, MD 05/01/17 [phone]

## 2017-05-01 NOTE — ED Triage Notes (Signed)
Pt states he has hx of fluid on left knee. Started having increase in knee pain last night. Denies new injury. Ambulatory at triage.

## 2017-05-01 NOTE — Discharge Instructions (Signed)
Your will need to see the orthopedic doctor to discuss any need for work changes.

## 2017-05-01 NOTE — ED Notes (Signed)
Declined W/C at D/C and was escorted to lobby by RN. 

## 2017-05-19 ENCOUNTER — Emergency Department (HOSPITAL_COMMUNITY)
Admission: EM | Admit: 2017-05-19 | Discharge: 2017-05-19 | Disposition: A | Discharge status: Home / Self Care | Payer: Medicaid Other | Attending: Emergency Medicine | Admitting: Emergency Medicine

## 2017-05-19 ENCOUNTER — Emergency Department (HOSPITAL_COMMUNITY): Payer: Medicaid Other

## 2017-05-19 ENCOUNTER — Encounter (HOSPITAL_COMMUNITY): Payer: Self-pay

## 2017-05-19 ENCOUNTER — Other Ambulatory Visit: Payer: Self-pay

## 2017-05-19 DIAGNOSIS — Z5321 Procedure and treatment not carried out due to patient leaving prior to being seen by health care provider: Secondary | ICD-10-CM | POA: Insufficient documentation

## 2017-05-19 DIAGNOSIS — M549 Dorsalgia, unspecified: Secondary | ICD-10-CM | POA: Insufficient documentation

## 2017-05-19 DIAGNOSIS — R0602 Shortness of breath: Secondary | ICD-10-CM | POA: Insufficient documentation

## 2017-05-19 NOTE — ED Triage Notes (Addendum)
Pt states left side back pain and feeling some shortness of breath. Pt states the pain is similar to the time he had a collapsed lung. Lung sounds clear on auscultation.

## 2017-05-19 NOTE — ED Notes (Signed)
Pt LWBS, EDP made aware.

## 2017-05-20 NOTE — ED Notes (Signed)
05/20/2017, Attempted follow-up call. No answer, phone rang busy.

## 2017-07-05 ENCOUNTER — Emergency Department (HOSPITAL_COMMUNITY)
Admission: EM | Admit: 2017-07-05 | Discharge: 2017-07-05 | Discharge status: Against Medical Advice | Payer: Medicaid Other | Attending: Emergency Medicine | Admitting: Emergency Medicine

## 2017-07-05 ENCOUNTER — Encounter (HOSPITAL_COMMUNITY): Payer: Self-pay

## 2017-07-05 DIAGNOSIS — Z5321 Procedure and treatment not carried out due to patient leaving prior to being seen by health care provider: Secondary | ICD-10-CM | POA: Insufficient documentation

## 2017-07-05 NOTE — ED Notes (Signed)
Pt states that he ate a burrito from [employer] and about one hour later had one episode of diarrhea

## 2017-11-01 ENCOUNTER — Emergency Department (HOSPITAL_COMMUNITY)
Admission: EM | Admit: 2017-11-01 | Discharge: 2017-11-02 | Disposition: A | Discharge status: Home / Self Care | Payer: Medicaid Other | Attending: Emergency Medicine | Admitting: Emergency Medicine

## 2017-11-01 ENCOUNTER — Other Ambulatory Visit: Payer: Self-pay

## 2017-11-01 ENCOUNTER — Encounter (HOSPITAL_COMMUNITY): Payer: Self-pay | Admitting: Emergency Medicine

## 2017-11-01 DIAGNOSIS — J3489 Other specified disorders of nose and nasal sinuses: Secondary | ICD-10-CM | POA: Insufficient documentation

## 2017-11-01 DIAGNOSIS — R05 Cough: Secondary | ICD-10-CM | POA: Insufficient documentation

## 2017-11-01 DIAGNOSIS — J029 Acute pharyngitis, unspecified: Secondary | ICD-10-CM | POA: Insufficient documentation

## 2017-11-01 DIAGNOSIS — Z5321 Procedure and treatment not carried out due to patient leaving prior to being seen by health care provider: Secondary | ICD-10-CM | POA: Insufficient documentation

## 2017-11-01 DIAGNOSIS — R509 Fever, unspecified: Secondary | ICD-10-CM | POA: Insufficient documentation

## 2017-11-01 NOTE — ED Notes (Signed)
Called pt x2 for vitals, no response. °

## 2017-11-01 NOTE — ED Triage Notes (Signed)
Pt reports fever, cough, sore throat, and sinus symptoms x 2 days.

## 2017-11-02 NOTE — ED Notes (Signed)
Pt. Called three times on separate occasions no answer. Pulling off floor.

## 2017-12-13 ENCOUNTER — Encounter (HOSPITAL_COMMUNITY): Payer: Self-pay | Admitting: Emergency Medicine

## 2017-12-13 ENCOUNTER — Other Ambulatory Visit: Payer: Self-pay

## 2017-12-13 ENCOUNTER — Emergency Department (HOSPITAL_COMMUNITY): Payer: Self-pay

## 2017-12-13 ENCOUNTER — Emergency Department (HOSPITAL_COMMUNITY)
Admission: EM | Admit: 2017-12-13 | Discharge: 2017-12-13 | Disposition: A | Discharge status: Home / Self Care | Payer: Medicaid Other | Attending: Emergency Medicine | Admitting: Emergency Medicine

## 2017-12-13 ENCOUNTER — Emergency Department (HOSPITAL_COMMUNITY)
Admission: EM | Admit: 2017-12-13 | Discharge: 2017-12-13 | Discharge status: Against Medical Advice | Payer: Self-pay | Attending: Emergency Medicine | Admitting: Emergency Medicine

## 2017-12-13 DIAGNOSIS — A749 Chlamydial infection, unspecified: Secondary | ICD-10-CM | POA: Insufficient documentation

## 2017-12-13 DIAGNOSIS — R079 Chest pain, unspecified: Secondary | ICD-10-CM | POA: Insufficient documentation

## 2017-12-13 DIAGNOSIS — Z202 Contact with and (suspected) exposure to infections with a predominantly sexual mode of transmission: Secondary | ICD-10-CM | POA: Insufficient documentation

## 2017-12-13 DIAGNOSIS — Z5321 Procedure and treatment not carried out due to patient leaving prior to being seen by health care provider: Secondary | ICD-10-CM | POA: Insufficient documentation

## 2017-12-13 DIAGNOSIS — Z87891 Personal history of nicotine dependence: Secondary | ICD-10-CM | POA: Insufficient documentation

## 2017-12-13 LAB — CBC
HCT: 39.7 % (ref 39.0–52.0)
Hemoglobin: 13 g/dL (ref 13.0–17.0)
MCH: 28 pg (ref 26.0–34.0)
MCHC: 32.7 g/dL (ref 30.0–36.0)
MCV: 85.6 fL (ref 80.0–100.0)
PLATELETS: 239 10*3/uL (ref 150–400)
RBC: 4.64 MIL/uL (ref 4.22–5.81)
RDW: 14.1 % (ref 11.5–15.5)
WBC: 8.6 10*3/uL (ref 4.0–10.5)
nRBC: 0 % (ref 0.0–0.2)

## 2017-12-13 LAB — URINALYSIS, ROUTINE W REFLEX MICROSCOPIC
Bilirubin Urine: NEGATIVE
GLUCOSE, UA: NEGATIVE mg/dL
Hgb urine dipstick: NEGATIVE
KETONES UR: NEGATIVE mg/dL
Leukocytes, UA: NEGATIVE
Nitrite: NEGATIVE
PH: 5 (ref 5.0–8.0)
Protein, ur: NEGATIVE mg/dL
SPECIFIC GRAVITY, URINE: 1.015 (ref 1.005–1.030)

## 2017-12-13 LAB — I-STAT TROPONIN, ED: Troponin i, poc: 0 ng/mL (ref 0.00–0.08)

## 2017-12-13 LAB — BASIC METABOLIC PANEL
ANION GAP: 10 (ref 5–15)
BUN: <5 mg/dL — ABNORMAL LOW (ref 6–20)
CALCIUM: 9.5 mg/dL (ref 8.9–10.3)
CO2: 29 mmol/L (ref 22–32)
Chloride: 102 mmol/L (ref 98–111)
Creatinine, Ser: 0.74 mg/dL (ref 0.61–1.24)
GFR calc Af Amer: >60 mL/min (ref >60)
Glucose, Bld: 104 mg/dL — ABNORMAL HIGH (ref 70–99)
Potassium: 3.8 mmol/L (ref 3.5–5.1)
SODIUM: 141 mmol/L (ref 135–145)

## 2017-12-13 MED ORDER — LIDOCAINE HCL (PF) 1 % IJ SOLN
INTRAMUSCULAR | Status: AC
  Administered 2017-12-13: 0.9 mL
  Filled 2017-12-13: qty 5

## 2017-12-13 MED ORDER — AZITHROMYCIN 250 MG PO TABS
1000.0000 mg | ORAL_TABLET | Freq: Once | ORAL | Status: AC
  Administered 2017-12-13: 1000 mg via ORAL
  Filled 2017-12-13: qty 4

## 2017-12-13 MED ORDER — CEFTRIAXONE SODIUM 250 MG IJ SOLR
250.0000 mg | Freq: Once | INTRAMUSCULAR | Status: AC
  Administered 2017-12-13: 250 mg via INTRAMUSCULAR
  Filled 2017-12-13: qty 250

## 2017-12-13 NOTE — Discharge Instructions (Addendum)
Today you have received prophylactic treatment for gonorrhea/chlamydia. You will not need to take any more medications for this after today. Abstain from sex for the next 7 days to prevent re-contraction or spread of any STDs.  HIV and syphilis results do not come back today. You will receive a call from [place] in 1-2 days if these are positive.  Be safe and use protection (condoms) to prevent sexually transmitted diseases.  For future STD checks, you can go to the health department. Thank you for allowing [place] to take care of you today.

## 2017-12-13 NOTE — ED Provider Notes (Addendum)
[facility] EMERGENCY DEPARTMENT Provider Note  CSN: [id number] Arrival date & time: 12/13/17  9528    History   Chief Complaint Chief Complaint  Patient presents with  . Exposure to STD    HPI Daniel Higgins is a 22 y.o. male with no significant medical history who presented to the ED for STD treatment. Patient currently has no physical complaints such as fever, chills, arthralgias or any GU complaints. However, he reports recent unprotected sex with a male 4 days ago. Denies past STDs, IV drug use or high risk sexual behavior.  History reviewed. No pertinent past medical history.  Patient Active Problem List   Diagnosis Date Noted  . 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 Primary spontaneous pneumothorax 10/24/2016    Past Surgical History:  Procedure Laterality Date  . CHEST TUBE INSERTION          Home Medications    Prior to Admission medications   Medication Sig Start Date End Date Taking? Authorizing Provider  diclofenac (VOLTAREN) 50 MG EC tablet Take 1 tablet (50 mg total) by mouth 2 (two) times daily. Patient not taking: Reported on 05/19/2017 05/01/17   Janne Napoleon, NP  oxyCODONE (OXY IR/ROXICODONE) 5 MG immediate release tablet Take 5 mg by mouth every 4-6 hours PRN severe pain Patient not taking: Reported on 05/19/2017 10/27/16   Ardelle Balls, PA-C    Family History No family history on file.  Social History Social History   Tobacco Use  . Smoking status: Former Smoker    Types: Cigars  . Smokeless tobacco: Never Used  Substance Use Topics  . Alcohol use: No  . Drug use: No     Allergies   Patient has no known allergies.   Review of Systems Review of Systems  Constitutional: Negative.   Gastrointestinal: Negative.   Genitourinary: Negative.   Musculoskeletal: Negative.   Skin: Negative.   Allergic/Immunologic: Negative for immunocompromised state.   Physical Exam Updated Vital Signs BP  107/63 (BP Location: Right Arm)   Pulse 69   Temp 98.1 F (36.7 C) (Oral)   Resp 16   SpO2 96%   Physical Exam  Constitutional: He appears well-developed and well-nourished.  Abdominal: Soft. Normal appearance and bowel sounds are normal. There is no tenderness.  Genitourinary: Testes normal and penis normal. Right testis shows no swelling and no tenderness. Left testis shows no swelling and no tenderness. Circumcised. No penile tenderness. No discharge found.  Genitourinary Comments: RN chaperone present. No genital lesions.  Lymphadenopathy: No inguinal adenopathy noted on the right or left side.  Skin: Skin is warm and intact. Capillary refill takes less than 2 seconds. No rash noted.  Nursing note and vitals reviewed.  ED Treatments / Results  Labs (all labs ordered are listed, but only abnormal results are displayed) Labs Reviewed  RPR  HIV ANTIBODY (ROUTINE TESTING W REFLEX)  URINALYSIS, ROUTINE W REFLEX MICROSCOPIC  GC/CHLAMYDIA PROBE AMP ([place]) NOT AT [facility]    EKG None  Radiology Dg Chest 2 View  Result Date: 12/13/2017 CLINICAL DATA:  Chest pain and shortness of breath tonight. EXAM: CHEST - 2 VIEW COMPARISON:  05/19/2017 FINDINGS: Pulmonary hyperinflation. Changes may represent emphysema or asthmatic changes. No airspace disease or consolidation. No blunting of costophrenic angles. No pneumothorax. Heart size and pulmonary vascularity are normal. IMPRESSION: Pulmonary hyperinflation.  No evidence of active pulmonary disease. Electronically Signed   By: Burman Nieves M.D.   On: 12/13/2017 02:28  Procedures Procedures (including critical care time)  Medications Ordered in ED Medications  cefTRIAXone (ROCEPHIN) injection 250 mg (250 mg Intramuscular Given 12/13/17 1055)  azithromycin (ZITHROMAX) tablet 1,000 mg (1,000 mg Oral Given 12/13/17 1054)  lidocaine (PF) (XYLOCAINE) 1 % injection (0.9 mLs  Given 12/13/17 1055)     Initial Impression /  Assessment and Plan / ED Course  Triage vital signs and the nursing notes have been reviewed.  Pertinent labs & imaging results that were available during care of the patient were reviewed and considered in medical decision making (see chart for details). Clinical Course as of Dec 14 1114  Tue Dec 13, 2017  1115 Patient left prior to UA resulting.   [GM]    Clinical Course User Index [GM] Novis League, Sharyon Medicus, PA-C   Patient presents to the ED for STD check. He has no GU or other complaints. Patient had recent unprotected sexual encounter. No other risk factors or physical exam findings that raise concern for HIV, HPV, syphilis, herpes, etc. Will order UA, HIV, RPR and GC/chlamydia swab.  Final Clinical Impressions(s) / ED Diagnoses  1. Exposure to STD. Received prophylactic gonorrhea/chlamydia treatment with Rocephin and Zithromax. Pending HIV and syphilis. Thorough education on safe sex practices and STIs.  Dispo: Home. After thorough clinical evaluation, this patient is determined to be medically stable and can be safely discharged with the previously mentioned treatment and/or outpatient follow-up/referral(s). At this time, there are no other apparent medical conditions that require further screening, evaluation or treatment.   Final diagnoses:  STD exposure    ED Discharge Orders    None        Windy Carina, PA-C 12/13/17 [address], [place] I, PA-C 12/13/17 1116    Cathren Laine, MD 12/13/17 1312

## 2017-12-13 NOTE — ED Notes (Signed)
Unable to find in lobby 

## 2017-12-13 NOTE — ED Notes (Signed)
ED Provider at bedside. 

## 2017-12-13 NOTE — ED Triage Notes (Signed)
Pt here for STD check, he states he had unprotected sex last Friday. Denies any symptoms.

## 2017-12-13 NOTE — ED Triage Notes (Signed)
Pt reports he woke up tonight with central CP no radiation. Pt denies SHOB, nausea, dizziness, or lightheadedness. Pt denies cardiac history.

## 2017-12-14 LAB — GC/CHLAMYDIA PROBE AMP (~~LOC~~) NOT AT ARMC
CHLAMYDIA, DNA PROBE: POSITIVE — AB
Neisseria Gonorrhea: NEGATIVE

## 2017-12-14 LAB — RPR: RPR Ser Ql: NONREACTIVE

## 2017-12-14 LAB — HIV ANTIBODY (ROUTINE TESTING W REFLEX): HIV SCREEN 4TH GENERATION: NONREACTIVE

## 2018-03-11 ENCOUNTER — Encounter (HOSPITAL_COMMUNITY): Payer: Self-pay | Admitting: Emergency Medicine

## 2018-03-11 ENCOUNTER — Emergency Department (HOSPITAL_COMMUNITY): Payer: Self-pay

## 2018-03-11 ENCOUNTER — Other Ambulatory Visit: Payer: Self-pay

## 2018-03-11 ENCOUNTER — Emergency Department (HOSPITAL_COMMUNITY)
Admission: EM | Admit: 2018-03-11 | Discharge: 2018-03-11 | Disposition: A | Discharge status: Home / Self Care | Payer: Self-pay | Attending: Emergency Medicine | Admitting: Emergency Medicine

## 2018-03-11 DIAGNOSIS — Z87891 Personal history of nicotine dependence: Secondary | ICD-10-CM | POA: Insufficient documentation

## 2018-03-11 DIAGNOSIS — R42 Dizziness and giddiness: Secondary | ICD-10-CM | POA: Insufficient documentation

## 2018-03-11 HISTORY — DX: Other pulmonary collapse: J98.19

## 2018-03-11 LAB — CBC WITH DIFFERENTIAL/PLATELET
Abs Immature Granulocytes: 0.02 10*3/uL (ref 0.00–0.07)
Basophils Absolute: 0.1 10*3/uL (ref 0.0–0.1)
Basophils Relative: 1 %
EOS ABS: 0.1 10*3/uL (ref 0.0–0.5)
EOS PCT: 2 %
HEMATOCRIT: 42.6 % (ref 39.0–52.0)
HEMOGLOBIN: 13.6 g/dL (ref 13.0–17.0)
IMMATURE GRANULOCYTES: 0 %
LYMPHS ABS: 2.9 10*3/uL (ref 0.7–4.0)
Lymphocytes Relative: 41 %
MCH: 27.4 pg (ref 26.0–34.0)
MCHC: 31.9 g/dL (ref 30.0–36.0)
MCV: 85.7 fL (ref 80.0–100.0)
MONOS PCT: 5 %
Monocytes Absolute: 0.4 10*3/uL (ref 0.1–1.0)
Neutro Abs: 3.5 10*3/uL (ref 1.7–7.7)
Neutrophils Relative %: 51 %
Platelets: 289 10*3/uL (ref 150–400)
RBC: 4.97 MIL/uL (ref 4.22–5.81)
RDW: 13.5 % (ref 11.5–15.5)
WBC: 7 10*3/uL (ref 4.0–10.5)
nRBC: 0 % (ref 0.0–0.2)

## 2018-03-11 LAB — BASIC METABOLIC PANEL
Anion gap: 8 (ref 5–15)
BUN: 5 mg/dL — ABNORMAL LOW (ref 6–20)
CO2: 26 mmol/L (ref 22–32)
Calcium: 9.1 mg/dL (ref 8.9–10.3)
Chloride: 104 mmol/L (ref 98–111)
Creatinine, Ser: 0.66 mg/dL (ref 0.61–1.24)
GFR calc non Af Amer: >60 mL/min (ref >60)
Glucose, Bld: 115 mg/dL — ABNORMAL HIGH (ref 70–99)
POTASSIUM: 3.2 mmol/L — AB (ref 3.5–5.1)
SODIUM: 138 mmol/L (ref 135–145)

## 2018-03-11 LAB — I-STAT TROPONIN, ED: Troponin i, poc: 0 ng/mL (ref 0.00–0.08)

## 2018-03-11 MED ORDER — LORAZEPAM 1 MG PO TABS
1.0000 mg | ORAL_TABLET | Freq: Once | ORAL | Status: AC
  Administered 2018-03-11: 1 mg via ORAL
  Filled 2018-03-11: qty 1

## 2018-03-11 NOTE — ED Triage Notes (Signed)
Pt reports dizziness, lightheadedness, and SHOB on exertion x 1 hour. Pt denies CP, N/V, abdominal pain. Pt reports hx of lung collapse last year.

## 2018-03-11 NOTE — ED Provider Notes (Signed)
[facility] EMERGENCY DEPARTMENT Provider Note   CSN: [id number] Arrival date & time: 03/11/18  1949     History   Chief Complaint Chief Complaint  Patient presents with  . Dizziness  . Shortness of Breath    HPI Daniel Higgins is a 23 y.o. male.  HPI   Patient is a 23 year old male with a history of a left-sided spontaneous pneumothorax who presents for evaluation of 2 hours of shortness of breath, lightheadedness, and palpitations that began after he took what he believes is THC infused edible.  Patient states his symptoms began after ingesting his edible.  He denies ingesting any other illicit drugs or substances today.  Denies EtOH use today.  Denies any headache, vertigo, vision changes, chest pain, vomiting, diarrhea, dysuria, abdominal pain, focal numbness or tingling in his extremities, rash, or other acute complaints.  Denies prior similar episodes.  Denies alleviating or aggravating factors.  Past Medical History:  Diagnosis Date  . Lung collapse     Patient Active Problem List   Diagnosis Date Noted  . 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 Primary spontaneous pneumothorax 10/24/2016    Past Surgical History:  Procedure Laterality Date  . CHEST TUBE INSERTION          Home Medications    Prior to Admission medications   Not on File    Family History History reviewed. No pertinent family history.  Social History Social History   Tobacco Use  . Smoking status: Former Smoker    Types: Cigars  . Smokeless tobacco: Never Used  Substance Use Topics  . Alcohol use: No  . Drug use: No     Allergies   Patient has no known allergies.   Review of Systems Review of Systems  Constitutional: Negative for chills and fever.  HENT: Negative for ear pain and sore throat.   Eyes: Negative for pain and visual disturbance.  Respiratory: Positive for shortness of breath. Negative for cough.   Cardiovascular: Positive for  palpitations. Negative for chest pain.  Gastrointestinal: Negative for abdominal pain and vomiting.  Genitourinary: Negative for dysuria and hematuria.  Musculoskeletal: Negative for arthralgias and back pain.  Skin: Negative for color change and rash.  Neurological: Positive for light-headedness. Negative for seizures and syncope.  All other systems reviewed and are negative.   Physical Exam Updated Vital Signs BP 104/69   Pulse 93   Temp 97.9 F (36.6 C) (Oral)   Resp 16   Ht 5\' 9"  (1.753 m)   Wt 54.4 kg   SpO2 99%   BMI 17.72 kg/m   Physical Exam Vitals signs and nursing note reviewed.  Constitutional:      Appearance: Normal appearance. He is well-developed and normal weight.  HENT:     Head: Normocephalic and atraumatic.     Right Ear: External ear normal.     Left Ear: External ear normal.     Nose: Nose normal.     Mouth/Throat:     Mouth: Mucous membranes are moist.  Eyes:     Extraocular Movements: Extraocular movements intact.     Right eye: No nystagmus.     Left eye: No nystagmus.     Conjunctiva/sclera: Conjunctivae normal.     Pupils: Pupils are equal, round, and reactive to light.  Neck:     Musculoskeletal: Normal range of motion and neck supple.  Cardiovascular:     Rate and Rhythm: Regular rhythm. Tachycardia present.  Heart sounds: No murmur.  Pulmonary:     Effort: Pulmonary effort is normal. No tachypnea or respiratory distress.     Breath sounds: Normal breath sounds. No decreased breath sounds, wheezing or rhonchi.  Chest:     Chest wall: No tenderness.  Abdominal:     Palpations: Abdomen is soft.     Tenderness: There is no abdominal tenderness.  Skin:    General: Skin is warm and dry.     Capillary Refill: Capillary refill takes less than 2 seconds.  Neurological:     General: No focal deficit present.     Mental Status: He is alert and oriented to person, place, and time.     Cranial Nerves: No cranial nerve deficit.    ED  Treatments / Results  Labs (all labs ordered are listed, but only abnormal results are displayed) Labs Reviewed  BASIC METABOLIC PANEL - Abnormal; Notable for the following components:      Result Value   Potassium 3.2 (*)    Glucose, Bld 115 (*)    BUN 5 (*)    All other components within normal limits  CBC WITH DIFFERENTIAL/PLATELET  I-STAT TROPONIN, ED    EKG EKG Interpretation  Date/Time:  Saturday March 11 2018 19:58:36 EST Ventricular Rate:  107 PR Interval:    QRS Duration: 89 QT Interval:  317 QTC Calculation: 423 R Axis:   80 Text Interpretation:  Sinus tachycardia Borderline T wave abnormalities Abnormal ekg Confirmed by Gerhard MunchLockwood, Robert [phone]([id number]) on 03/11/2018 8:17:53 PM   Radiology Dg Chest 2 View  Result Date: 03/11/2018 CLINICAL DATA:  Dizziness and shortness of breath. EXAM: CHEST - 2 VIEW COMPARISON:  12/13/2017 FINDINGS: Lungs are mildly hyperexpanded. Clustered apparent subtle nodularity in the right lung may reflect atypical infection. Left lung unremarkable. The cardiopericardial silhouette is within normal limits for size. The visualized bony structures of the thorax are intact. Telemetry leads overlie the chest. IMPRESSION: Possible subtle clustered nodularity in the right upper lung. Atypical infection would be a consideration. Electronically Signed   By: Kennith CenterEric  Mansell M.D.   On: 03/11/2018 20:39    Procedures Procedures (including critical care time)  Medications Ordered in ED Medications  LORazepam (ATIVAN) tablet 1 mg (1 mg Oral Given 03/11/18 2040)     Initial Impression / Assessment and Plan / ED Course  I have reviewed the triage vital signs and the nursing notes.  Pertinent labs & imaging results that were available during my care of the patient were reviewed by me and considered in my medical decision making (see chart for details).     Patient is a 23 year old male who presents above-stated history exam.  On presentation patient is  afebrile with a heart rate of 107 otherwise stable vital signs on room air.  Exam as above remarkable for lungs clear to auscultation bilaterally and no focal neurological deficits.  Patient is alert and oriented x3.  ECG shows a ventricular rate of 107, sinus rhythm, normal intervals, normal axis, no signs of acute ischemic change.  Troponin is undetectable.  Given absence of chest pain have a low suspicion for ACS.  History exam is not consistent with PE or aortic dissection.  CBC shows a WBC count of 7, hemoglobin 13.6, platelets 289.  BMP shows potassium of 3.2 with otherwise no significant electrolyte or metabolic abnormalities.  Patient stated he preferred p.o. repletion and that he would get a Gatorade on the way home.  Chest x-ray does not show any findings  suggestive of pneumonia, pneumothorax, pulmonary edema, or other acute intrathoracic abnormalities.  Given history of THC use immediately preceding onset of symptoms I suspect that this may be precipitating the patient's palpitations and lightheadedness.  Patient was given p.o. Ativan with resolution of symptoms and tachycardia.  History exam is not consistent with CVA, sepsis, meningitis, or other imminently life-threatening etiology.  Patient discharged in stable condition.  Strict return precautions advised and discussed.  Instructed to follow-up with PCP in 3 to 5 days and refrain from [facility] use.   Final Clinical Impressions(s) / ED Diagnoses   Final diagnoses:  Lightheadedness    ED Discharge Orders    None       Antoine PrimasSmith, Zachary, MD 03/11/18 2229    Gerhard MunchLockwood, Robert, MD 03/11/18 2352

## 2018-04-06 ENCOUNTER — Encounter (HOSPITAL_COMMUNITY): Payer: Self-pay | Admitting: *Deleted

## 2018-04-06 ENCOUNTER — Other Ambulatory Visit: Payer: Self-pay

## 2018-04-06 ENCOUNTER — Emergency Department (HOSPITAL_COMMUNITY)
Admission: EM | Admit: 2018-04-06 | Discharge: 2018-04-07 | Disposition: A | Discharge status: Home / Self Care | Payer: Self-pay | Attending: Emergency Medicine | Admitting: Emergency Medicine

## 2018-04-06 DIAGNOSIS — B349 Viral infection, unspecified: Secondary | ICD-10-CM | POA: Insufficient documentation

## 2018-04-06 DIAGNOSIS — J029 Acute pharyngitis, unspecified: Secondary | ICD-10-CM

## 2018-04-06 DIAGNOSIS — Z87891 Personal history of nicotine dependence: Secondary | ICD-10-CM | POA: Insufficient documentation

## 2018-04-06 MED ORDER — ACETAMINOPHEN 500 MG PO TABS
1000.0000 mg | ORAL_TABLET | Freq: Once | ORAL | Status: AC
  Administered 2018-04-06: 1000 mg via ORAL
  Filled 2018-04-06: qty 2

## 2018-04-06 NOTE — ED Provider Notes (Signed)
[facility] EMERGENCY DEPARTMENT Provider Note   CSN: [id number] Arrival date & time: 04/06/18  2204     History   Chief Complaint Chief Complaint  Patient presents with  . Influenza    HPI Daniel Higgins is a 23 y.o. male.  Patient presents to the emergency department with a chief complaint of sore throat and fever.  He states that his symptoms started 2 days ago.  States that fever ran as high as 104 today.  Last Tylenol was at 6 PM today.  Denies any cough.  Denies any abdominal pain.  Denies any dysuria.  Did not get a flu shot.  Denies any other associated symptoms.  The history is provided by the patient. No language interpreter was used.    Past Medical History:  Diagnosis Date  . Lung collapse     Patient Active Problem List   Diagnosis Date Noted  . 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 Primary spontaneous pneumothorax 10/24/2016    Past Surgical History:  Procedure Laterality Date  . CHEST TUBE INSERTION          Home Medications    Prior to Admission medications   Not on File    Family History No family history on file.  Social History Social History   Tobacco Use  . Smoking status: Former Smoker    Types: Cigars  . Smokeless tobacco: Never Used  Substance Use Topics  . Alcohol use: No  . Drug use: No     Allergies   Patient has no known allergies.   Review of Systems Review of Systems  All other systems reviewed and are negative.    Physical Exam Updated Vital Signs BP (!) 106/58 (BP Location: Right Arm)   Pulse (!) 115   Temp (!) 101.1 F (38.4 C) (Oral)   Resp 20   SpO2 99%   Physical Exam Vitals signs and nursing note reviewed.  Constitutional:      Appearance: He is well-developed.  HENT:     Head: Normocephalic and atraumatic.     Mouth/Throat:     Comments: Oropharynx is mildly erythematous, no tonsillar exudate, no abscess, uvula is midline Eyes:     General: No scleral  icterus.       Right eye: No discharge.        Left eye: No discharge.     Conjunctiva/sclera: Conjunctivae normal.     Pupils: Pupils are equal, round, and reactive to light.  Neck:     Musculoskeletal: Normal range of motion and neck supple.     Vascular: No JVD.  Cardiovascular:     Rate and Rhythm: Normal rate and regular rhythm.     Heart sounds: Normal heart sounds. No murmur. No friction rub. No gallop.   Pulmonary:     Effort: Pulmonary effort is normal. No respiratory distress.     Breath sounds: Normal breath sounds. No wheezing or rales.     Comments: CTAB Chest:     Chest wall: No tenderness.  Abdominal:     General: There is no distension.     Palpations: Abdomen is soft. There is no mass.     Tenderness: There is no abdominal tenderness. There is no guarding or rebound.  Musculoskeletal: Normal range of motion.        General: No tenderness.  Skin:    General: Skin is warm and dry.  Neurological:     Mental Status: He is alert and  oriented to person, place, and time.  Psychiatric:        Behavior: Behavior normal.        Thought Content: Thought content normal.        Judgment: Judgment normal.      ED Treatments / Results  Labs (all labs ordered are listed, but only abnormal results are displayed) Labs Reviewed  GROUP A STREP BY PCR  INFLUENZA PANEL BY PCR (TYPE A & B)    EKG None  Radiology No results found.  Procedures Procedures (including critical care time)  Medications Ordered in ED Medications  acetaminophen (TYLENOL) tablet 1,000 mg (has no administration in time range)     Initial Impression / Assessment and Plan / ED Course  I have reviewed the triage vital signs and the nursing notes.  Pertinent labs & imaging results that were available during my care of the patient were reviewed by me and considered in my medical decision making (see chart for details).     Pt afebrile without tonsillar exudate, negative strep. Presents with  mild cervical lymphadenopathy, & dysphagia; diagnosis of viral pharyngitis. No abx indicated. DC w symptomatic tx for pain  Pt does not appear dehydrated, but did discuss importance of water rehydration. Presentation non concerning for PTA or infxn spread to soft tissue. No trismus or uvula deviation. Specific return precautions discussed. Pt able to drink water in ED without difficulty with intact air way. Recommended PCP follow up.  Flu test negative.  Strep negative.  No cough.  Patient gave verbal consent allowing for me to interview them and deliver results in front of any visitors present.   Final Clinical Impressions(s) / ED Diagnoses   Final diagnoses:  Viral syndrome  Pharyngitis, unspecified etiology    ED Discharge Orders    None       Roxy Horseman, PA-C 04/07/18 6433    Shaune Pollack, MD 04/08/18 [phone]

## 2018-04-06 NOTE — ED Triage Notes (Signed)
Pt c/o headache, bodyaches and fever for the past 2 days. Last took tylenol at 6pm

## 2018-04-07 LAB — INFLUENZA PANEL BY PCR (TYPE A & B)
Influenza A By PCR: NEGATIVE
Influenza B By PCR: NEGATIVE

## 2018-04-07 LAB — GROUP A STREP BY PCR: Group A Strep by PCR: NOT DETECTED

## 2018-04-10 ENCOUNTER — Encounter (HOSPITAL_COMMUNITY): Payer: Self-pay

## 2018-04-10 ENCOUNTER — Other Ambulatory Visit: Payer: Self-pay

## 2018-04-10 ENCOUNTER — Emergency Department (HOSPITAL_COMMUNITY)
Admission: EM | Admit: 2018-04-10 | Discharge: 2018-04-10 | Disposition: A | Discharge status: Home / Self Care | Payer: Self-pay | Attending: Emergency Medicine | Admitting: Emergency Medicine

## 2018-04-10 DIAGNOSIS — Z87891 Personal history of nicotine dependence: Secondary | ICD-10-CM | POA: Insufficient documentation

## 2018-04-10 DIAGNOSIS — F419 Anxiety disorder, unspecified: Secondary | ICD-10-CM | POA: Insufficient documentation

## 2018-04-10 DIAGNOSIS — R0981 Nasal congestion: Secondary | ICD-10-CM | POA: Insufficient documentation

## 2018-04-10 DIAGNOSIS — J029 Acute pharyngitis, unspecified: Secondary | ICD-10-CM | POA: Insufficient documentation

## 2018-04-10 DIAGNOSIS — R42 Dizziness and giddiness: Secondary | ICD-10-CM | POA: Insufficient documentation

## 2018-04-10 DIAGNOSIS — H9191 Unspecified hearing loss, right ear: Secondary | ICD-10-CM | POA: Insufficient documentation

## 2018-04-10 DIAGNOSIS — R61 Generalized hyperhidrosis: Secondary | ICD-10-CM | POA: Insufficient documentation

## 2018-04-10 DIAGNOSIS — R0602 Shortness of breath: Secondary | ICD-10-CM | POA: Insufficient documentation

## 2018-04-10 DIAGNOSIS — Z5329 Procedure and treatment not carried out because of patient's decision for other reasons: Secondary | ICD-10-CM | POA: Insufficient documentation

## 2018-04-10 LAB — CBC
HCT: 39.7 % (ref 39.0–52.0)
Hemoglobin: 12.7 g/dL — ABNORMAL LOW (ref 13.0–17.0)
MCH: 27 pg (ref 26.0–34.0)
MCHC: 32 g/dL (ref 30.0–36.0)
MCV: 84.3 fL (ref 80.0–100.0)
NRBC: 0 % (ref 0.0–0.2)
Platelets: 243 10*3/uL (ref 150–400)
RBC: 4.71 MIL/uL (ref 4.22–5.81)
RDW: 14.1 % (ref 11.5–15.5)
WBC: 4.6 10*3/uL (ref 4.0–10.5)

## 2018-04-10 LAB — URINALYSIS, ROUTINE W REFLEX MICROSCOPIC
Bilirubin Urine: NEGATIVE
Glucose, UA: NEGATIVE mg/dL
Hgb urine dipstick: NEGATIVE
Ketones, ur: NEGATIVE mg/dL
Leukocytes, UA: NEGATIVE
Nitrite: NEGATIVE
Protein, ur: NEGATIVE mg/dL
Specific Gravity, Urine: 1.018 (ref 1.005–1.030)
pH: 7 (ref 5.0–8.0)

## 2018-04-10 LAB — BASIC METABOLIC PANEL
Anion gap: 7 (ref 5–15)
BUN: 13 mg/dL (ref 6–20)
CO2: 30 mmol/L (ref 22–32)
Calcium: 9.2 mg/dL (ref 8.9–10.3)
Chloride: 101 mmol/L (ref 98–111)
Creatinine, Ser: 0.7 mg/dL (ref 0.61–1.24)
GFR calc non Af Amer: >60 mL/min (ref >60)
Glucose, Bld: 69 mg/dL — ABNORMAL LOW (ref 70–99)
Potassium: 3.6 mmol/L (ref 3.5–5.1)
Sodium: 138 mmol/L (ref 135–145)

## 2018-04-10 MED ORDER — MECLIZINE HCL 25 MG PO TABS
25.0000 mg | ORAL_TABLET | Freq: Once | ORAL | Status: AC
  Administered 2018-04-10: 25 mg via ORAL
  Filled 2018-04-10: qty 1

## 2018-04-10 MED ORDER — SODIUM CHLORIDE 0.9 % IV BOLUS
1000.0000 mL | Freq: Once | INTRAVENOUS | Status: AC
  Administered 2018-04-10: 1000 mL via INTRAVENOUS

## 2018-04-10 NOTE — ED Triage Notes (Signed)
Pt states that since getting over his fever, he has had SHOB and dizziness. Pt's breathing is unlabored in triage.

## 2018-04-10 NOTE — ED Provider Notes (Signed)
[facility]-EMERGENCY DEPT Provider Note   CSN: [id number] Arrival date & time: 04/10/18  1732     History   Chief Complaint Chief Complaint  Patient presents with  . Dizziness    HPI Daniel Higgins is a 23 y.o. male.  HPI Patient presents with dizziness which started yesterday.  He has difficulty describing the dizziness.  States he feels "funny in his head."  Dizziness is worse with movement of the head. Denies nausea vomiting.  States he has had some decreased hearing in his right ear at times.  No fever or chills.  Sore throat is improved.  States that when he feels dizzy he becomes quite anxious and hyperventilates.  Sometimes breaks out in sweat.  He is asking for something for anxiety.  He denies any focal weakness or numbness. Past Medical History:  Diagnosis Date  . Lung collapse     Patient Active Problem List   Diagnosis Date Noted  . 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 Primary spontaneous pneumothorax 10/24/2016    Past Surgical History:  Procedure Laterality Date  . CHEST TUBE INSERTION          Home Medications    Prior to Admission medications   Not on File    Family History No family history on file.  Social History Social History   Tobacco Use  . Smoking status: Former Smoker    Types: Cigars  . Smokeless tobacco: Never Used  Substance Use Topics  . Alcohol use: No  . Drug use: No     Allergies   Patient has no known allergies.   Review of Systems Review of Systems  Constitutional: Positive for diaphoresis. Negative for chills and fever.  HENT: Positive for hearing loss. Negative for congestion, rhinorrhea, sinus pressure, sinus pain, sore throat, tinnitus and trouble swallowing.   Eyes: Negative for photophobia and visual disturbance.  Respiratory: Positive for shortness of breath. Negative for cough.   Cardiovascular: Negative for chest pain, palpitations and leg swelling.    Gastrointestinal: Negative for abdominal pain, constipation, diarrhea, nausea and vomiting.  Genitourinary: Negative for dysuria, flank pain and frequency.  Musculoskeletal: Negative for back pain, myalgias and neck pain.  Skin: Negative for rash and wound.  Neurological: Positive for dizziness and light-headedness. Negative for tremors, weakness, numbness and headaches.  Psychiatric/Behavioral: The patient is nervous/anxious.   All other systems reviewed and are negative.    Physical Exam Updated Vital Signs BP 97/67 (BP Location: Right Arm)   Pulse 91   Temp 98.7 F (37.1 C) (Oral)   Resp 16   Ht 5\' 9"  (1.753 m)   Wt 54.4 kg   SpO2 99%   BMI 17.71 kg/m   Physical Exam Vitals signs and nursing note reviewed.  Constitutional:      General: He is not in acute distress.    Appearance: Normal appearance. He is well-developed. He is not ill-appearing or toxic-appearing.  HENT:     Head: Normocephalic and atraumatic.     Right Ear: Tympanic membrane normal.     Left Ear: Tympanic membrane normal.     Nose: Congestion present.     Mouth/Throat:     Mouth: Mucous membranes are moist.     Pharynx: No oropharyngeal exudate or posterior oropharyngeal erythema.  Eyes:     Extraocular Movements: Extraocular movements intact.     Pupils: Pupils are equal, round, and reactive to light.     Comments: Patient has few beats of  fatigable horizontal nystagmus when looking to the left.  Dizziness is exaggerated with turning head to the left.  Neck:     Musculoskeletal: Normal range of motion and neck supple. No neck rigidity or muscular tenderness.     Comments: No meningismus Cardiovascular:     Rate and Rhythm: Normal rate and regular rhythm.     Heart sounds: No murmur. No friction rub. No gallop.   Pulmonary:     Effort: Pulmonary effort is normal. No respiratory distress.     Breath sounds: Normal breath sounds. No stridor. No wheezing, rhonchi or rales.  Chest:     Chest wall:  No tenderness.  Abdominal:     General: Bowel sounds are normal.     Palpations: Abdomen is soft.     Tenderness: There is no abdominal tenderness. There is no guarding or rebound.  Musculoskeletal: Normal range of motion.        General: No swelling, tenderness, deformity or signs of injury.     Right lower leg: No edema.     Left lower leg: No edema.  Lymphadenopathy:     Cervical: No cervical adenopathy.  Skin:    General: Skin is warm and dry.     Findings: No erythema or rash.  Neurological:     General: No focal deficit present.     Mental Status: He is alert and oriented to person, place, and time.     Comments: Patient is alert and oriented x3 with clear, goal oriented speech. Patient has 5/5 motor in all extremities. Sensation is intact to light touch. Bilateral finger-to-nose is normal with no signs of dysmetria.   Psychiatric:        Behavior: Behavior normal.     Comments: Anxious appearing      ED Treatments / Results  Labs (all labs ordered are listed, but only abnormal results are displayed) Labs Reviewed  BASIC METABOLIC PANEL - Abnormal; Notable for the following components:      Result Value   Glucose, Bld 69 (*)    All other components within normal limits  CBC - Abnormal; Notable for the following components:   Hemoglobin 12.7 (*)    All other components within normal limits  URINALYSIS, ROUTINE W REFLEX MICROSCOPIC - Abnormal; Notable for the following components:   APPearance CLOUDY (*)    All other components within normal limits    EKG None  Radiology No results found.  Procedures Procedures (including critical care time)  Medications Ordered in ED Medications  meclizine (ANTIVERT) tablet 25 mg (25 mg Oral Given 04/10/18 2000)  sodium chloride 0.9 % bolus 1,000 mL (0 mLs Intravenous Stopped 04/10/18 2104)     Initial Impression / Assessment and Plan / ED Course  I have reviewed the triage vital signs and the nursing notes.  Pertinent  labs & imaging results that were available during my care of the patient were reviewed by me and considered in my medical decision making (see chart for details).     Patient left prior to repeat evaluation and discharge instructions.  Final Clinical Impressions(s) / ED Diagnoses   Final diagnoses:  Dizziness    ED Discharge Orders    None       Loren Racer, MD 04/10/18 2326

## 2018-04-10 NOTE — ED Notes (Addendum)
Patient noted to be absent from room. IV removed and hanging from the fluid line. Patient presumed to have left.

## 2018-05-04 ENCOUNTER — Other Ambulatory Visit: Payer: Self-pay

## 2018-05-04 ENCOUNTER — Emergency Department (HOSPITAL_COMMUNITY)
Admission: EM | Admit: 2018-05-04 | Discharge: 2018-05-04 | Disposition: A | Discharge status: Home / Self Care | Payer: Self-pay | Attending: Emergency Medicine | Admitting: Emergency Medicine

## 2018-05-04 DIAGNOSIS — Z711 Person with feared health complaint in whom no diagnosis is made: Secondary | ICD-10-CM

## 2018-05-04 DIAGNOSIS — Z87891 Personal history of nicotine dependence: Secondary | ICD-10-CM | POA: Insufficient documentation

## 2018-05-04 DIAGNOSIS — Z202 Contact with and (suspected) exposure to infections with a predominantly sexual mode of transmission: Secondary | ICD-10-CM | POA: Insufficient documentation

## 2018-05-04 NOTE — Discharge Instructions (Addendum)
Go to the health department for testing. This is available by appointment for free (testing and treatment).

## 2018-05-04 NOTE — ED Triage Notes (Signed)
Patient reports he needs treatment for STI - states he has had exposure to someone who tested positive for chlamydia. Denies any symptoms.

## 2018-05-04 NOTE — ED Provider Notes (Signed)
[facility] EMERGENCY DEPARTMENT Provider Note   CSN: [id number] Arrival date & time: 05/04/18  1245    History   Chief Complaint Chief Complaint  Patient presents with  . Exposure to STD    HPI Daniel Higgins is a 23 y.o. male.     23 year old male presents with request for STD testing.  Patient denies any symptoms or complaints today.  Reviewed triage note with patient which states he was exposed to chlamydia and needed treatment, patient denies this and states that he has not had any exposure he just wants to be tested.     Past Medical History:  Diagnosis Date  . Lung collapse     Patient Active Problem List   Diagnosis Date Noted  . 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 Primary spontaneous pneumothorax 10/24/2016    Past Surgical History:  Procedure Laterality Date  . CHEST TUBE INSERTION          Home Medications    Prior to Admission medications   Not on File    Family History No family history on file.  Social History Social History   Tobacco Use  . Smoking status: Former Smoker    Types: Cigars  . Smokeless tobacco: Never Used  Substance Use Topics  . Alcohol use: No  . Drug use: No     Allergies   Patient has no known allergies.   Review of Systems Review of Systems  Constitutional: Negative for fever.  Gastrointestinal: Negative for abdominal pain.  Genitourinary: Negative for discharge, dysuria, penile pain, penile swelling, scrotal swelling, testicular pain and urgency.  Skin: Negative for rash.  Allergic/Immunologic: Negative for immunocompromised state.  Hematological: Negative for adenopathy.     Physical Exam Updated Vital Signs BP 109/73 (BP Location: Right Arm)   Pulse 90   Temp 98.5 F (36.9 C) (Oral)   Resp 16   SpO2 98%   Physical Exam Vitals signs and nursing note reviewed.  Constitutional:      General: He is not in acute distress.    Appearance: He is well-developed.  He is not diaphoretic.  HENT:     Head: Normocephalic and atraumatic.  Cardiovascular:     Rate and Rhythm: Normal rate and regular rhythm.     Heart sounds: Normal heart sounds.  Pulmonary:     Effort: Pulmonary effort is normal.     Breath sounds: Normal breath sounds.  Neurological:     Mental Status: He is alert and oriented to person, place, and time.  Psychiatric:        Behavior: Behavior normal.      ED Treatments / Results  Labs (all labs ordered are listed, but only abnormal results are displayed) Labs Reviewed - No data to display  EKG None  Radiology No results found.  Procedures Procedures (including critical care time)  Medications Ordered in ED Medications - No data to display   Initial Impression / Assessment and Plan / ED Course  I have reviewed the triage vital signs and the nursing notes.  Pertinent labs & imaging results that were available during my care of the patient were reviewed by me and considered in my medical decision making (see chart for details).  Clinical Course as of May 04 1310  Thu May 04, 2018  [id number] 23 year old male requests STD testing, denies exposure despite triage note.  Patient denies any symptoms, specifically denies dysuria, discharge, swelling or pain in his testicles, rashes or lesions.  Heart and lung exam normal. Patient to follow up with the health department for screening and any necessary treatment.    [LM]    Clinical Course User Index [LM] Jeannie Fend, PA-C   Final Clinical Impressions(s) / ED Diagnoses   Final diagnoses:  Concern about STD in male without diagnosis    ED Discharge Orders    None       Alden Hipp 05/04/18 1312    Tilden Fossa, MD 05/05/18 1046

## 2018-05-04 NOTE — ED Notes (Signed)
Patient verbalizes understanding of discharge instructions. Opportunity for questioning and answers were provided. Armband removed by staff, pt discharged from ED.  

## 2018-05-16 ENCOUNTER — Other Ambulatory Visit: Payer: Self-pay

## 2018-05-16 ENCOUNTER — Emergency Department (HOSPITAL_COMMUNITY)
Admission: EM | Admit: 2018-05-16 | Discharge: 2018-05-16 | Disposition: A | Discharge status: Home / Self Care | Payer: Self-pay | Attending: Emergency Medicine | Admitting: Emergency Medicine

## 2018-05-16 ENCOUNTER — Encounter (HOSPITAL_COMMUNITY): Payer: Self-pay

## 2018-05-16 DIAGNOSIS — A64 Unspecified sexually transmitted disease: Secondary | ICD-10-CM

## 2018-05-16 DIAGNOSIS — A749 Chlamydial infection, unspecified: Secondary | ICD-10-CM | POA: Insufficient documentation

## 2018-05-16 DIAGNOSIS — Z87891 Personal history of nicotine dependence: Secondary | ICD-10-CM | POA: Insufficient documentation

## 2018-05-16 HISTORY — DX: Unspecified sexually transmitted disease: A64

## 2018-05-16 MED ORDER — AZITHROMYCIN 1 G PO PACK
1.0000 g | PACK | Freq: Once | ORAL | Status: AC
  Administered 2018-05-16: 1 g via ORAL
  Filled 2018-05-16: qty 1

## 2018-05-16 NOTE — ED Provider Notes (Signed)
[facility]-EMERGENCY DEPT Provider Note   CSN: [id number] Arrival date & time: 05/16/18  1700    History   Chief Complaint Chief Complaint  Patient presents with  . STI Treatment    HPI Daniel Higgins is a 23 y.o. male.     23 year old male presents with diagnoses recently of chlamydia.  [facility].  Denies any dysuria or penile drainage or discharge.  No fever or chills.  No suprapubic or flank pain.  This is the patient's third episode of an STI.     Past Medical History:  Diagnosis Date  . Lung collapse   . STI (sexually transmitted infection) 05/16/2018    Patient Active Problem List   Diagnosis Date Noted  . 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 Primary spontaneous pneumothorax 10/24/2016    Past Surgical History:  Procedure Laterality Date  . CHEST TUBE INSERTION          Home Medications    Prior to Admission medications   Not on File    Family History No family history on file.  Social History Social History   Tobacco Use  . Smoking status: Former Smoker    Types: Cigars  . Smokeless tobacco: Never Used  Substance Use Topics  . Alcohol use: No  . Drug use: No     Allergies   Patient has no known allergies.   Review of Systems Review of Systems  All other systems reviewed and are negative.    Physical Exam Updated Vital Signs BP 122/68 (BP Location: Left Arm)   Pulse 89   Temp 98.1 F (36.7 C) (Oral)   Resp 15   Ht 1.753 m (5\' 9" )   Wt 54 kg   SpO2 99%   BMI 17.58 kg/m   Physical Exam Vitals signs and nursing note reviewed.  Constitutional:      General: Daniel Higgins is not in acute distress.    Appearance: Normal appearance. Daniel Higgins is well-developed. Daniel Higgins is not toxic-appearing.  HENT:     Head: Normocephalic and atraumatic.  Eyes:     General: Lids are normal.     Conjunctiva/sclera: Conjunctivae normal.     Pupils: Pupils are equal, round, and  reactive to light.  Neck:     Musculoskeletal: Normal range of motion and neck supple.     Thyroid: No thyroid mass.     Trachea: No tracheal deviation.  Cardiovascular:     Rate and Rhythm: Normal rate and regular rhythm.     Heart sounds: Normal heart sounds. No murmur. No gallop.   Pulmonary:     Effort: Pulmonary effort is normal. No respiratory distress.     Breath sounds: Normal breath sounds. No stridor. No decreased breath sounds, wheezing, rhonchi or rales.  Abdominal:     General: Bowel sounds are normal. There is no distension.     Palpations: Abdomen is soft.     Tenderness: There is no abdominal tenderness. There is no rebound.  Genitourinary:    Penis: Circumcised. No discharge.   Musculoskeletal: Normal range of motion.        General: No tenderness.  Skin:    General: Skin is warm and dry.     Findings: No abrasion or rash.  Neurological:     Mental Status: Daniel Higgins is alert and oriented to person, place, and time.     GCS: GCS eye subscore is 4. GCS verbal subscore is 5.  GCS motor subscore is 6.     Cranial Nerves: No cranial nerve deficit.     Sensory: No sensory deficit.  Psychiatric:        Speech: Speech normal.        Behavior: Behavior normal.      ED Treatments / Results  Labs (all labs ordered are listed, but only abnormal results are displayed) Labs Reviewed - No data to display  EKG None  Radiology No results found.  Procedures Procedures (including critical care time)  Medications Ordered in ED Medications - No data to display   Initial Impression / Assessment and Plan / ED Course  I have reviewed the triage vital signs and the nursing notes.  Pertinent labs & imaging results that were available during my care of the patient were reviewed by me and considered in my medical decision making (see chart for details).        Patient treated with Zithromax and given return precautions  Final Clinical Impressions(s) / ED Diagnoses    Final diagnoses:  None    ED Discharge Orders    None       Lorre Nick, MD 05/16/18 1740

## 2018-05-16 NOTE — ED Triage Notes (Signed)
Patient arrived via POV, AOx4 and ambulatory. Patient was seen yesterday for STI and tested. Patient received call stating that patient tested positive for Chlamydia. Patient has no other complaints.

## 2018-05-20 ENCOUNTER — Encounter (HOSPITAL_COMMUNITY): Payer: Self-pay

## 2018-05-20 ENCOUNTER — Emergency Department (HOSPITAL_COMMUNITY)
Admission: EM | Admit: 2018-05-20 | Discharge: 2018-05-20 | Disposition: A | Discharge status: Home / Self Care | Payer: Self-pay | Attending: Emergency Medicine | Admitting: Emergency Medicine

## 2018-05-20 ENCOUNTER — Other Ambulatory Visit: Payer: Self-pay

## 2018-05-20 DIAGNOSIS — R000 Tachycardia, unspecified: Secondary | ICD-10-CM | POA: Insufficient documentation

## 2018-05-20 DIAGNOSIS — J029 Acute pharyngitis, unspecified: Secondary | ICD-10-CM | POA: Insufficient documentation

## 2018-05-20 DIAGNOSIS — Z87891 Personal history of nicotine dependence: Secondary | ICD-10-CM | POA: Insufficient documentation

## 2018-05-20 DIAGNOSIS — M791 Myalgia, unspecified site: Secondary | ICD-10-CM | POA: Insufficient documentation

## 2018-05-20 LAB — INFLUENZA PANEL BY PCR (TYPE A & B)
Influenza A By PCR: NEGATIVE
Influenza B By PCR: NEGATIVE

## 2018-05-20 LAB — GROUP A STREP BY PCR: Group A Strep by PCR: NOT DETECTED

## 2018-05-20 MED ORDER — IBUPROFEN 800 MG PO TABS
800.0000 mg | ORAL_TABLET | Freq: Once | ORAL | Status: AC
  Administered 2018-05-20: 800 mg via ORAL
  Filled 2018-05-20: qty 1

## 2018-05-20 MED ORDER — CEPHALEXIN 500 MG PO CAPS: 500.0000 mg | ORAL_CAPSULE | Freq: Two times a day (BID) | ORAL | 0 refills | Status: AC

## 2018-05-20 NOTE — ED Notes (Signed)
Pt and friend wanting to know wait time, aware we are waiting for results then the PA will evaluate.

## 2018-05-20 NOTE — Discharge Instructions (Addendum)
Your strep test was negative, it's possible that you have a viral pharyngitis. Continue to stay well-hydrated. Gargle warm salt water and spit it out and use chloraseptic spray as needed for sore throat. Continue to alternate between Tylenol and Ibuprofen for pain or fever. If your symptoms are worsening or don't improve in the next 2-3 days, then start the antibiotic as directed and take it until it's completed. Follow up with your primary care doctor in 5-7 days for recheck of ongoing symptoms. Return to emergency department for emergent changing or worsening of symptoms.

## 2018-05-20 NOTE — ED Provider Notes (Signed)
[facility]-EMERGENCY DEPT Provider Note   CSN: [id number] Arrival date & time: 05/20/18  1628    History   Chief Complaint Chief Complaint  Patient presents with  . Sore Throat  . Generalized Body Aches    HPI    Daniel Higgins is a 23 y.o. male with a PMHx of spontaneous pneumothroax, who presents to the ED with complaints of body aches mostly in his legs, sore throat, and fever with Tmax 103.7 at home that began yesterday.  Symptoms are constant and moderate.  He has used TheraFlu and Tylenol with some relief.  Nothing seems to aggravate his symptoms.  He denies any recent sick contacts or travel.  He has had strep throat in the past and believes he might have it now.  He denies any rhinorrhea, drooling, trismus, ear pain or drainage, cough, chest pain, shortness of breath, domino pain, nausea, vomiting, diarrhea, constipation, dysuria, hematuria, numbness, tingling, focal weakness, or any other complaints at this time.  The history is provided by the patient and medical records. No language interpreter was used.  Sore Throat  Pertinent negatives include no chest pain, no abdominal pain and no shortness of breath.    Past Medical History:  Diagnosis Date  . Lung collapse   . STI (sexually transmitted infection) 05/16/2018    Patient Active Problem List   Diagnosis Date Noted  . 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 Primary spontaneous pneumothorax 10/24/2016    Past Surgical History:  Procedure Laterality Date  . CHEST TUBE INSERTION          Home Medications    Prior to Admission medications   Not on File    Family History No family history on file.  Social History Social History   Tobacco Use  . Smoking status: Former Smoker    Types: Cigars  . Smokeless tobacco: Never Used  Substance Use Topics  . Alcohol use: No  . Drug use: No     Allergies   Patient has no known allergies.   Review of Systems Review of  Systems  Constitutional: Positive for chills and fever.  HENT: Positive for sore throat. Negative for ear discharge, ear pain, rhinorrhea and trouble swallowing.   Respiratory: Negative for cough and shortness of breath.   Cardiovascular: Negative for chest pain.  Gastrointestinal: Negative for abdominal pain, constipation, diarrhea, nausea and vomiting.  Genitourinary: Negative for dysuria and hematuria.  Musculoskeletal: Positive for myalgias. Negative for arthralgias.  Skin: Negative for color change.  Allergic/Immunologic: Negative for immunocompromised state.  Neurological: Negative for weakness and numbness.  Psychiatric/Behavioral: Negative for confusion.   All other systems reviewed and are negative for acute change except as noted in the HPI.    Physical Exam Updated Vital Signs BP 113/65 (BP Location: Right Arm)   Pulse (!) 112   Temp (!) 102.7 F (39.3 C) (Oral)   Resp 18   SpO2 100%   Physical Exam Vitals signs and nursing note reviewed.  Constitutional:      General: He is not in acute distress.    Appearance: Normal appearance. He is well-developed. He is not toxic-appearing.     Comments: Febrile to 102.7, nontoxic, NAD, not septic appearing, very well appearing  HENT:     Head: Normocephalic and atraumatic.     Nose: Nose normal.     Mouth/Throat:     Mouth: Mucous membranes are moist.     Pharynx: Uvula midline. Pharyngeal swelling, oropharyngeal exudate  and posterior oropharyngeal erythema present. No uvula swelling.     Tonsils: Tonsillar exudate present. No tonsillar abscesses. 2+ on the right. 2+ on the left.     Comments: Oropharynx erythematous, without uvular swelling or deviation, no trismus or drooling, 2+ b/l tonsillar swelling and erythema, +exudates.  No PTA.  Eyes:     General:        Right eye: No discharge.        Left eye: No discharge.     Conjunctiva/sclera: Conjunctivae normal.  Neck:     Musculoskeletal: Normal range of motion and neck  supple.  Cardiovascular:     Rate and Rhythm: Regular rhythm. Tachycardia present.     Pulses: Normal pulses.     Heart sounds: Normal heart sounds, S1 normal and S2 normal. No murmur. No friction rub. No gallop.      Comments: Mildly tachycardic Pulmonary:     Effort: Pulmonary effort is normal. No respiratory distress.     Breath sounds: Normal breath sounds. No decreased breath sounds, wheezing, rhonchi or rales.     Comments: CTAB in all lung fields, no w/r/r, no hypoxia or increased WOB, speaking in full sentences, SpO2 100% on RA  Abdominal:     General: Bowel sounds are normal. There is no distension.     Palpations: Abdomen is soft. Abdomen is not rigid.     Tenderness: There is no abdominal tenderness. There is no right CVA tenderness, left CVA tenderness, guarding or rebound. Negative signs include Murphy's sign and McBurney's sign.  Musculoskeletal: Normal range of motion.  Lymphadenopathy:     Head:     Right side of head: Tonsillar adenopathy present.     Left side of head: Tonsillar adenopathy present.     Cervical: Cervical adenopathy present.     Comments: Tonsillar and shotty cervical LAD bilaterally  Skin:    General: Skin is warm and dry.     Findings: No rash.  Neurological:     Mental Status: He is alert and oriented to person, place, and time.     Sensory: Sensation is intact. No sensory deficit.     Motor: Motor function is intact.  Psychiatric:        Mood and Affect: Mood and affect normal.        Behavior: Behavior normal.      ED Treatments / Results  Labs (all labs ordered are listed, but only abnormal results are displayed) Labs Reviewed  GROUP A STREP BY PCR  INFLUENZA PANEL BY PCR (TYPE A & B)    EKG None  Radiology No results found.  Procedures Procedures (including critical care time)  Medications Ordered in ED Medications  ibuprofen (ADVIL,MOTRIN) tablet 800 mg (800 mg Oral Given 05/20/18 1653)     Initial Impression /  Assessment and Plan / ED Course  I have reviewed the triage vital signs and the nursing notes.  Pertinent labs & imaging results that were available during my care of the patient were reviewed by me and considered in my medical decision making (see chart for details).        23 y.o. male here with fever, body aches, and sore throat. On exam, febrile to 102.7, mildly tachycardic, but not septic appearing, actually fairly well appearing, in NAD, throat erythematous with 2+ b/l tonsillar swelling and exudates, tonsillar LAD bilaterally, clear lung exam. Pt without cough and no travel/sick contacts, doubt need for COVID19 testing. Will check strep and flu tests and  give ibuprofen then reassess shortly.   6:26 PM Strep negative. Flu negative. Could be viral pharyngitis, still doubt COVID19 due to lack of lower respiratory symptoms. Doubt need for further emergent work up at this time. Advised OTC remedies for symptomatic relief. Given appearance of tonsils and fevers, will provide safety script for keflex; advised that if symptoms worsen, or fail to improve in the next few days, then start using keflex to cover for other bacterial pharyngitis etiologies. Strict guidelines advised for use of this. F/up with PCP in 1wk for recheck. I explained the diagnosis and have given explicit precautions to return to the ER including for any other new or worsening symptoms. The patient understands and accepts the medical plan as it's been dictated and I have answered their questions. Discharge instructions concerning home care and prescriptions have been given. The patient is STABLE and is discharged to home in good condition.    Final Clinical Impressions(s) / ED Diagnoses   Final diagnoses:  Viral pharyngitis    ED Discharge Orders         Ordered    cephALEXin (KEFLEX) 500 MG capsule  2 times daily     05/20/18 [address] [place], [place] 05/20/18 1826    Charlynne Pander, MD 05/20/18  [phone]

## 2018-05-20 NOTE — ED Triage Notes (Signed)
Pt states that he has been having body aches. Pt states that he is also having a sore throat.

## 2018-06-14 ENCOUNTER — Encounter (HOSPITAL_COMMUNITY): Payer: Self-pay | Admitting: Emergency Medicine

## 2018-06-14 ENCOUNTER — Other Ambulatory Visit: Payer: Self-pay

## 2018-06-14 ENCOUNTER — Emergency Department (HOSPITAL_COMMUNITY)
Admission: EM | Admit: 2018-06-14 | Discharge: 2018-06-14 | Disposition: A | Discharge status: Home / Self Care | Payer: Self-pay | Attending: Emergency Medicine | Admitting: Emergency Medicine

## 2018-06-14 DIAGNOSIS — Z87891 Personal history of nicotine dependence: Secondary | ICD-10-CM | POA: Insufficient documentation

## 2018-06-14 DIAGNOSIS — J302 Other seasonal allergic rhinitis: Secondary | ICD-10-CM

## 2018-06-14 DIAGNOSIS — J301 Allergic rhinitis due to pollen: Secondary | ICD-10-CM | POA: Insufficient documentation

## 2018-06-14 MED ORDER — LORATADINE 10 MG PO TABS: 10.0000 mg | ORAL_TABLET | Freq: Every day | ORAL | 0 refills | Status: DC

## 2018-06-14 MED ORDER — FLUTICASONE PROPIONATE 50 MCG/ACT NA SUSP: 2.0000 | Freq: Every day | NASAL | 0 refills | Status: DC

## 2018-06-14 NOTE — ED Provider Notes (Signed)
[facility] Provider Note   CSN: [id number] Arrival date & time: 06/14/18  1437    History   Chief Complaint Chief Complaint  Patient presents with  . Headache    HPI Daniel Higgins is a 23 y.o. male.     HPI Patient presents with nasal congestion and periodic left-sided headaches due to pollen exposure he believes.  Currently denying headache.  No fever or chills.  No visual changes.  No neck pain or stiffness.  No focal weakness or numbness. Past Medical History:  Diagnosis Date  . Lung collapse   . STI (sexually transmitted infection) 05/16/2018    Patient Active Problem List   Diagnosis Date Noted  . 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 Primary spontaneous pneumothorax 10/24/2016    Past Surgical History:  Procedure Laterality Date  . CHEST TUBE INSERTION          Home Medications    Prior to Admission medications   Medication Sig Start Date End Date Taking? Authorizing Provider  fluticasone (FLONASE) 50 MCG/ACT nasal spray Place 2 sprays into both nostrils daily. 06/14/18   Loren Racer, MD  loratadine (CLARITIN) 10 MG tablet Take 1 tablet (10 mg total) by mouth daily. 06/14/18   Loren Racer, MD    Family History No family history on file.  Social History Social History   Tobacco Use  . Smoking status: Former Smoker    Types: Cigars  . Smokeless tobacco: Never Used  Substance Use Topics  . Alcohol use: No  . Drug use: No     Allergies   Patient has no known allergies.   Review of Systems Review of Systems  Constitutional: Negative for chills, fatigue and fever.  HENT: Positive for congestion and sinus pressure. Negative for ear pain, facial swelling, sore throat and trouble swallowing.   Eyes: Negative for pain, redness and visual disturbance.  Respiratory: Negative for cough and shortness of breath.   Gastrointestinal: Negative for nausea and vomiting.  Musculoskeletal: Negative  for back pain, neck pain and neck stiffness.  Skin: Negative for rash and wound.  Neurological: Positive for headaches. Negative for dizziness, weakness, light-headedness and numbness.  All other systems reviewed and are negative.    Physical Exam Updated Vital Signs BP 137/75 (BP Location: Left Arm)   Pulse 99   Temp 97.7 F (36.5 C) (Oral)   Resp 16   SpO2 100%   Physical Exam Vitals signs and nursing note reviewed.  Constitutional:      General: He is not in acute distress.    Appearance: Normal appearance. He is well-developed. He is not ill-appearing.  HENT:     Head: Normocephalic and atraumatic.     Comments: Bilateral nasal mucosal edema.  No temporal artery tenderness to palpation.  No mastoid erythema or tenderness to percussion.  Oropharynx is clear.    Nose: Congestion present.     Mouth/Throat:     Mouth: Mucous membranes are moist.     Pharynx: No oropharyngeal exudate or posterior oropharyngeal erythema.  Eyes:     Extraocular Movements: Extraocular movements intact.     Pupils: Pupils are equal, round, and reactive to light.  Neck:     Musculoskeletal: Normal range of motion and neck supple. No neck rigidity or muscular tenderness.     Comments: No meningismus Cardiovascular:     Rate and Rhythm: Normal rate and regular rhythm.  Pulmonary:     Effort: Pulmonary effort is normal.  Breath sounds: Normal breath sounds.  Abdominal:     General: Bowel sounds are normal.     Palpations: Abdomen is soft.     Tenderness: There is no abdominal tenderness. There is no guarding or rebound.  Musculoskeletal: Normal range of motion.        General: No tenderness.  Lymphadenopathy:     Cervical: No cervical adenopathy.  Skin:    General: Skin is warm and dry.     Findings: No erythema or rash.  Neurological:     General: No focal deficit present.     Mental Status: He is alert and oriented to person, place, and time.     Comments: 5/5 motor in all  extremities.  Sensation fully intact.  Psychiatric:        Mood and Affect: Mood normal.        Behavior: Behavior normal.      ED Treatments / Results  Labs (all labs ordered are listed, but only abnormal results are displayed) Labs Reviewed - No data to display  EKG None  Radiology No results found.  Procedures Procedures (including critical care time)  Medications Ordered in ED Medications - No data to display   Initial Impression / Assessment and Plan / ED Course  I have reviewed the triage vital signs and the nursing notes.  Pertinent labs & imaging results that were available during my care of the patient were reviewed by me and considered in my medical decision making (see chart for details).        Patient with likely seasonal allergies and headaches associated with this.  No red flag signs or symptoms necessitating imaging at this time.  Will start on Claritin and Flonase.  Advises Tylenol and ibuprofen as needed for headache.  Return precautions given  Final Clinical Impressions(s) / ED Diagnoses   Final diagnoses:  Seasonal allergies    ED Discharge Orders         Ordered    loratadine (CLARITIN) 10 MG tablet  Daily     06/14/18 1453    fluticasone (FLONASE) 50 MCG/ACT nasal spray  Daily     06/14/18 1453           Loren RacerYelverton, Kelle Ruppert, MD 06/14/18 1501

## 2018-06-14 NOTE — ED Triage Notes (Signed)
Pt c/o getting headaches when around pollen that last 5-6 minutes. Pt has been wearing do rag that leaves lines on his head.

## 2018-06-19 ENCOUNTER — Ambulatory Visit: Payer: Self-pay

## 2018-06-19 NOTE — Telephone Encounter (Signed)
      Thelma Barge Male, 23 y.o., 07-Jun-1995 MRN:  [id number] Phone:  [phone] (H) PCP:  System, Pcp Not In Coverage:  None Message from Abbi R Burchel sent at 06/19/2018 11:07 AM EDT   Summary: Seasonal Allergies?   Pt has runny nose with bloody discharge. Pt has no other sx and does not have PCP.    [phone]        Call History    Type Contact Phone  06/19/2018 11:12 AM Phone ([address] Amirr, Likes R (Self) [phone] (H)  User: Esther Hardy, RN  Left Message   06/19/2018 11:06 AM Phone (Incoming) Dakhari, Chichester R (Self) [phone] (H)  User: Ronald Lobo, Abbi R  Encounter Report   Patient Encounter Report

## 2018-06-19 NOTE — Telephone Encounter (Signed)
Incoming call from  Patient with a complaint of blowing his nose an obtaining  Blood tinged  Mucous x's one.  Denies watery itchy eyes Using allery.  Reports a cough.  States the mucous with blood tinged mucous.  Was a one time event.  Was seen in the ER 5 days ago.   Was treated there Rx were given for nasal spray.  Allergy pills.  Encouraged to call back if Sx worsen.   Reason for Disposition . [1] Nasal allergies AND [2] only certain times of year (hay fever)  Answer Assessment - Initial Assessment Questions 1. SYMPTOM: "What's the main symptom you're concerned about?" (e.g., runny nose, stuffiness, sneezing, itching)  blowing nose  alittle blood come out 2. SEVERITY: "How bad is it?" "What does it keep you from doing?" (e.g., sleeping, working)      *No Answer* 3. EYES: "Are the eyes also red, watery, and itchy?"      denies 4. TRIGGER: "What pollen or other allergic substance do you think is causing the symptoms?"      pollen 5. TREATMENT: "What medicine are you using?" "What medicine worked best in the past?"     Allergy pills nasal spray  6. OTHER SYMPTOMS: "Do you have any other symptoms?" (e.g., coughing, difficulty breathing, wheezing)     Coughing,  Every now and then  7. PREGNANCY: "Is there any chance you are pregnant?" "When was your last menstrual period?"     na  Protocols used: NASAL ALLERGIES (HAY FEVER)-A-AH

## 2018-07-06 IMAGING — DX DG CHEST 2V
2 series · 2 of 2 positions shown · non-contrast
Comparison: PA and lateral chest x-ray October 27, 2016

CLINICAL DATA: Left-sided chest pain. Recent pneumothorax.
Follow-up study.

EXAM:
CHEST  2 VIEW

[dg chest 2 view (1 of 2)]
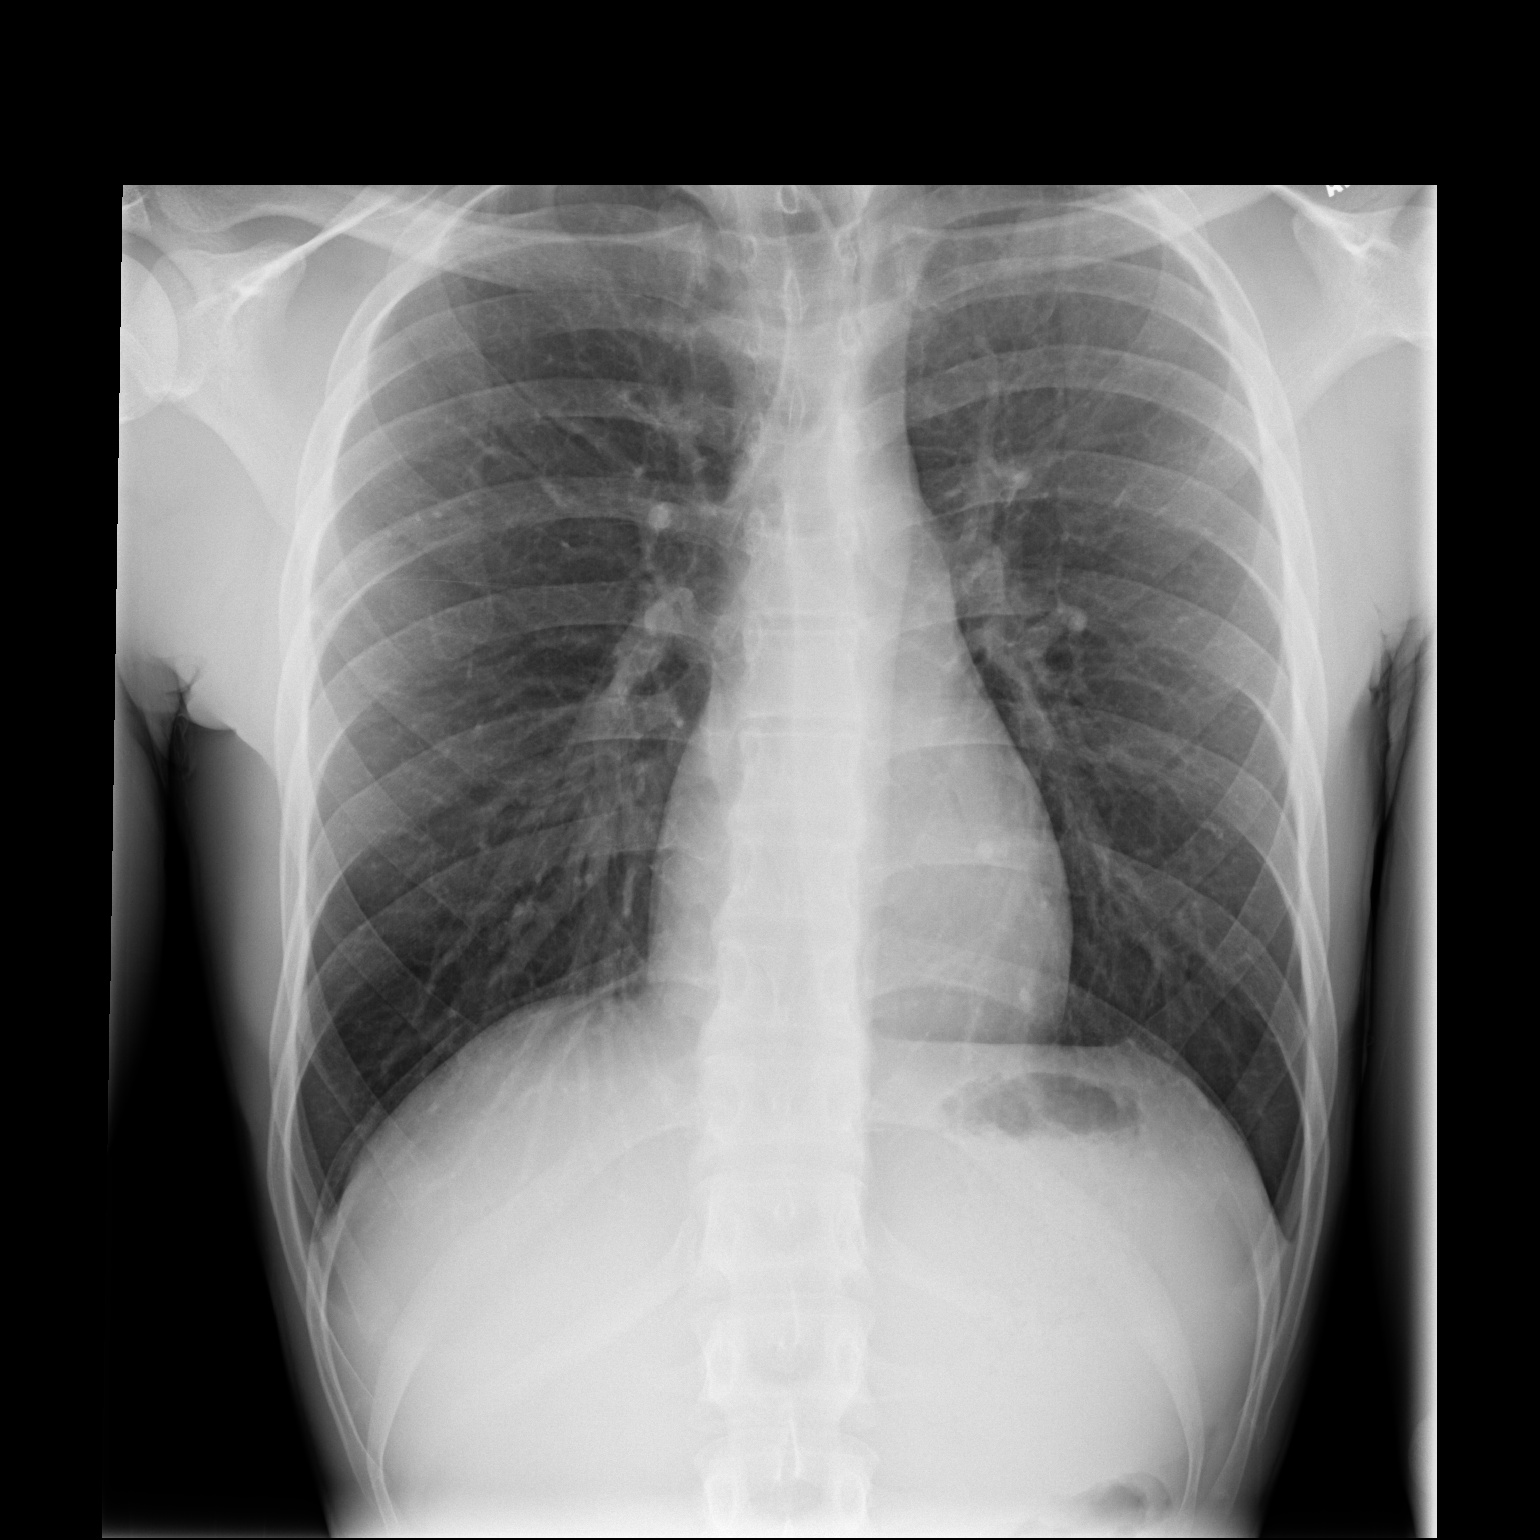

[dg chest 2 view (2 of 2)]
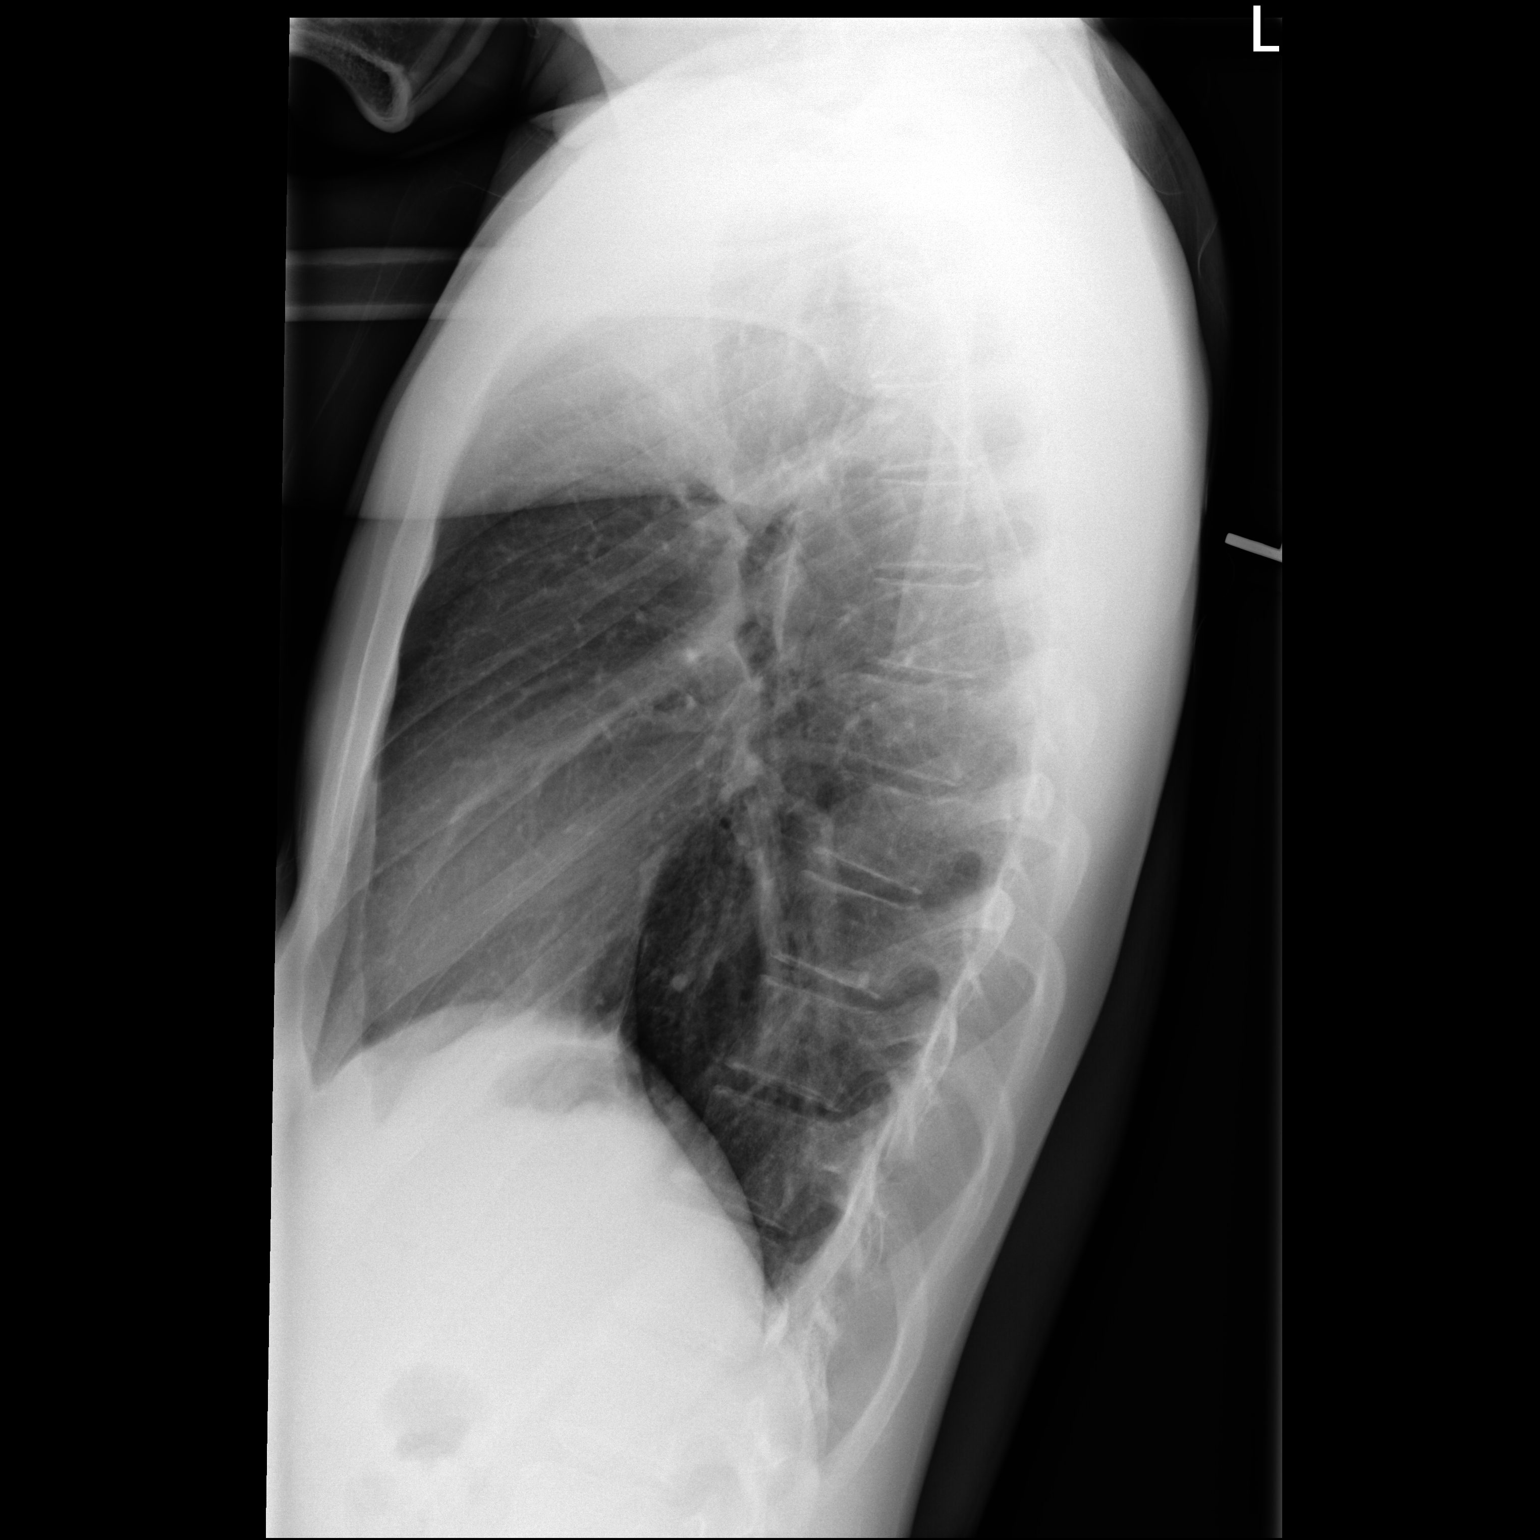

[2 of 2 positions shown; findings below may reference images not displayed]

FINDINGS: The tiny left apical pneumothorax is no longer evident. There is no
right-sided pneumothorax. There is no pleural effusion or alveolar
infiltrate. The heart and pulmonary vascularity are normal. The
mediastinum is normal in width. The bony thorax is unremarkable.
IMPRESSION: There is no acute cardiopulmonary abnormality. The tiny left apical
pneumothorax has resolved.

## 2018-07-08 IMAGING — CR DG CHEST 2V
2 series · 2 of 2 positions shown · non-contrast
Comparison: 11/02/2016

CLINICAL DATA: Left posterior chest pain. Shortness of breath. Had
a chest tube 7 days ago for collapsed lung.

EXAM:
CHEST  2 VIEW

[w chest pa]
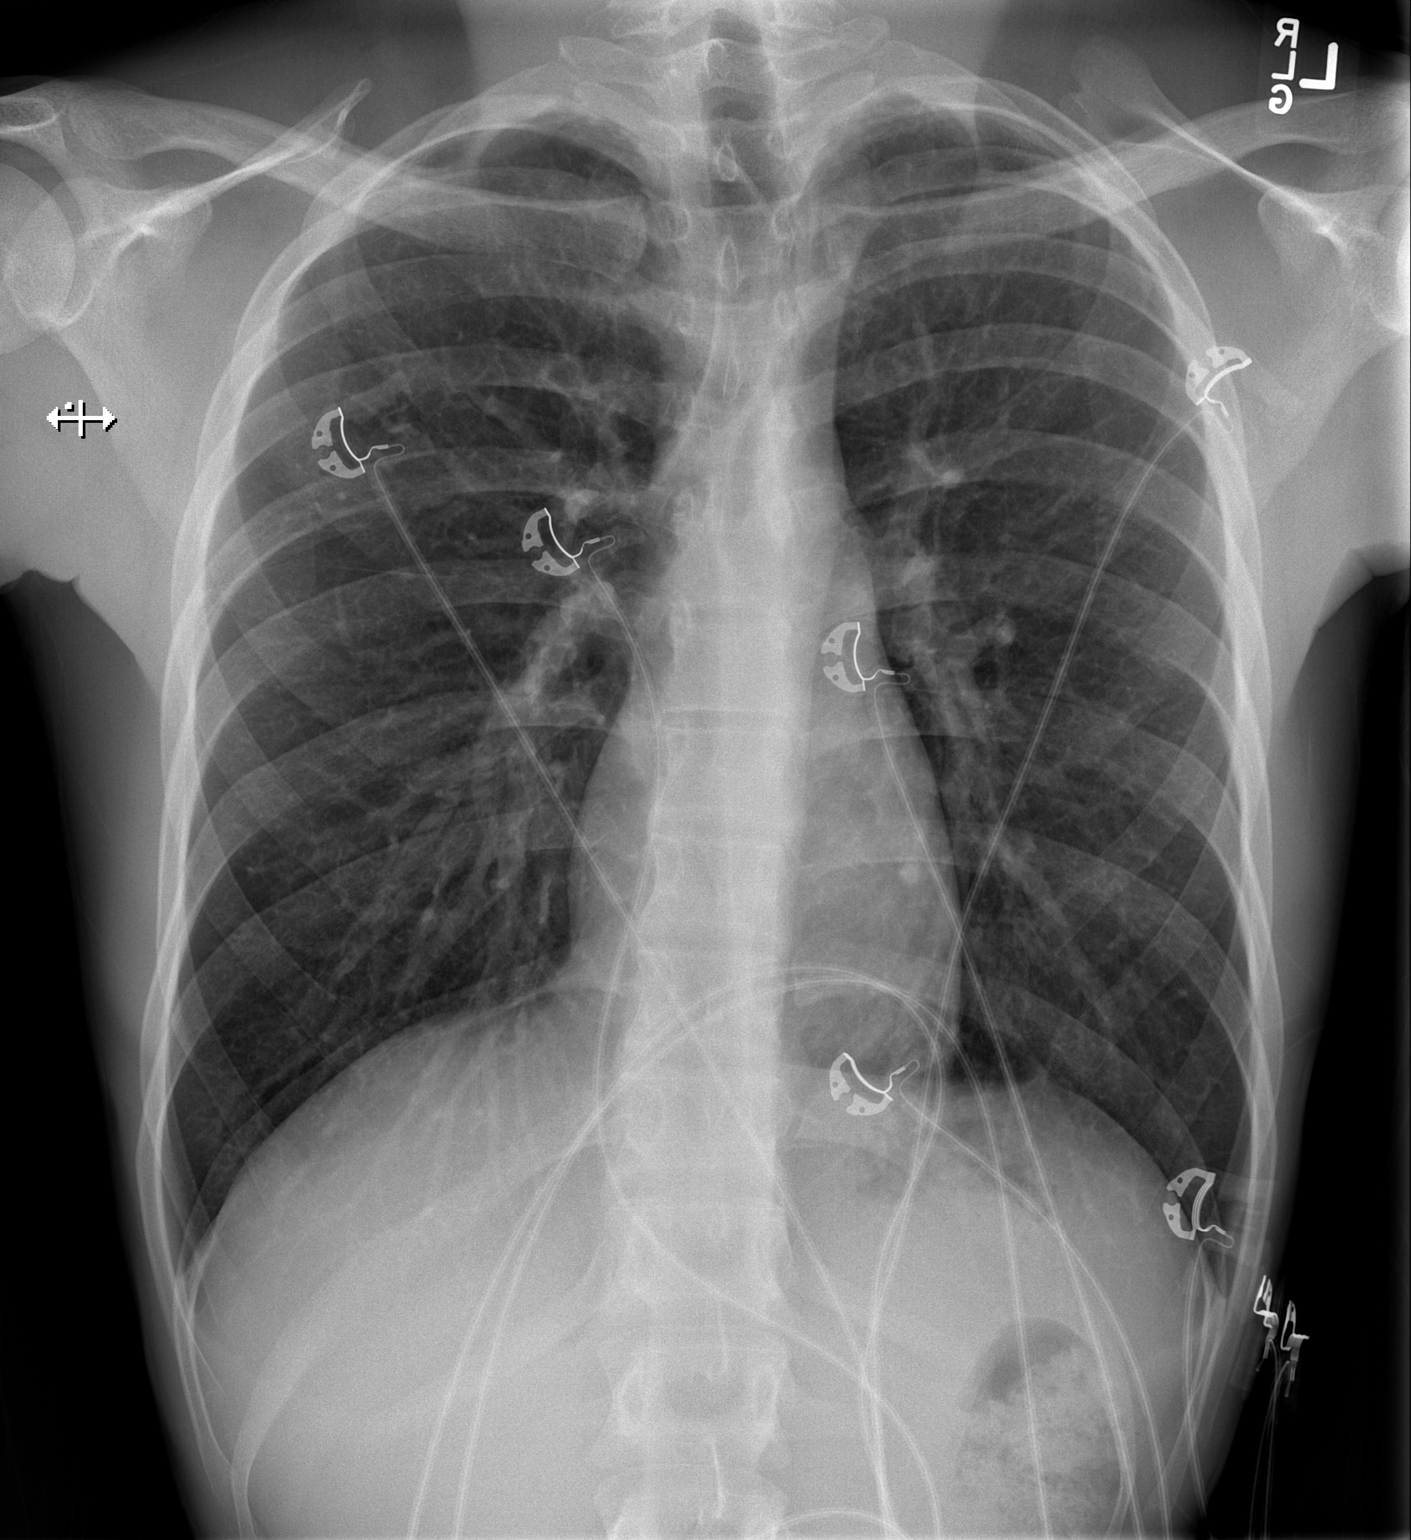

[w chest lat]
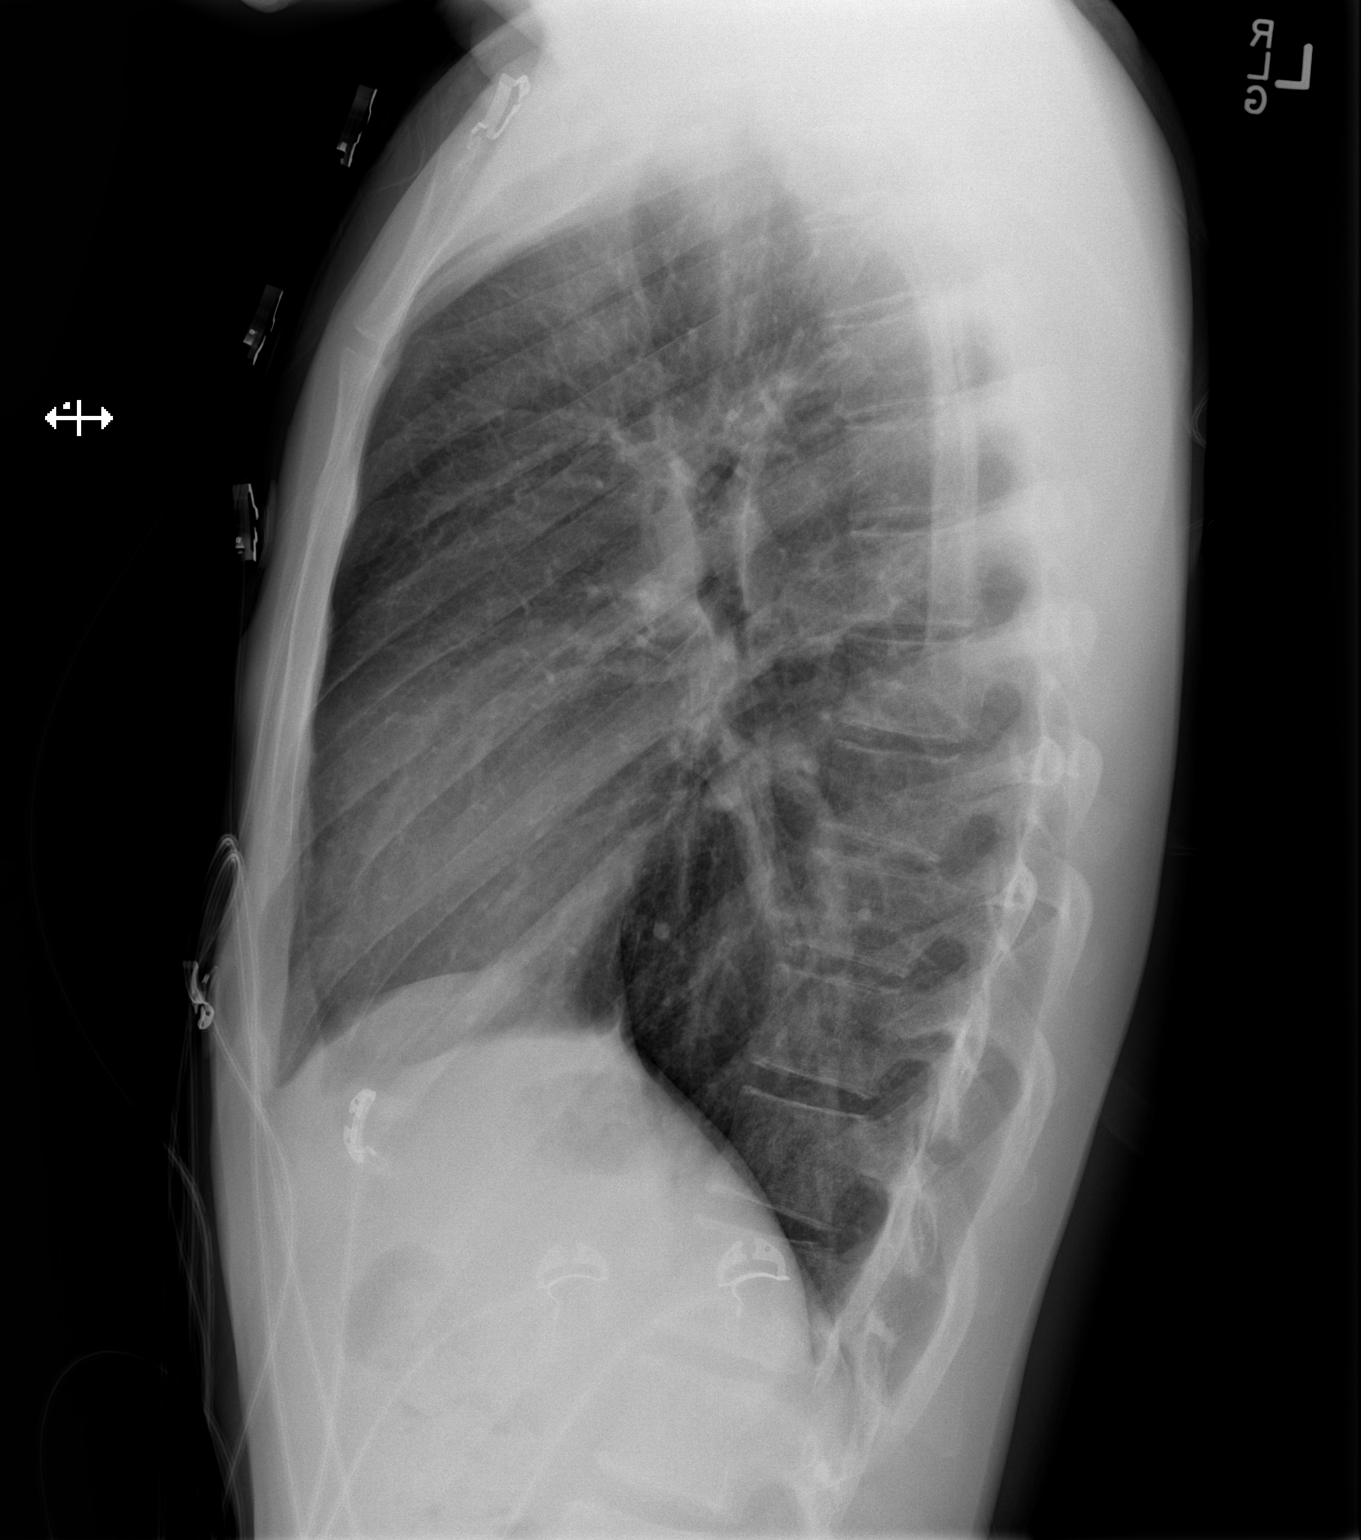

[2 of 2 positions shown; findings below may reference images not displayed]

FINDINGS: There is no edema, consolidation, effusion, or pneumothorax. Normal
heart size and mediastinal contours. No osseous findings.
IMPRESSION: Negative chest.  No evidence of pneumothorax recurrence.

## 2018-08-18 ENCOUNTER — Encounter (HOSPITAL_COMMUNITY): Payer: Self-pay

## 2018-08-18 ENCOUNTER — Other Ambulatory Visit: Payer: Self-pay

## 2018-08-18 DIAGNOSIS — Z5321 Procedure and treatment not carried out due to patient leaving prior to being seen by health care provider: Secondary | ICD-10-CM | POA: Insufficient documentation

## 2018-08-18 DIAGNOSIS — H9201 Otalgia, right ear: Secondary | ICD-10-CM | POA: Insufficient documentation

## 2018-08-18 MED ORDER — ACETAMINOPHEN 325 MG PO TABS
650.0000 mg | ORAL_TABLET | Freq: Once | ORAL | Status: AC | PRN
  Administered 2018-08-18: 650 mg via ORAL
  Filled 2018-08-18: qty 2

## 2018-08-18 NOTE — ED Triage Notes (Signed)
Pt c/o R ear pain and decreased hearing with fever since this morning. Denies any drainage.

## 2018-08-19 ENCOUNTER — Emergency Department (HOSPITAL_COMMUNITY)
Admission: EM | Admit: 2018-08-19 | Discharge: 2018-08-19 | Disposition: A | Discharge status: Home / Self Care | Payer: Self-pay | Attending: Emergency Medicine | Admitting: Emergency Medicine

## 2018-09-13 ENCOUNTER — Encounter (HOSPITAL_COMMUNITY): Payer: Self-pay | Admitting: Family Medicine

## 2018-09-13 ENCOUNTER — Emergency Department (HOSPITAL_COMMUNITY)
Admission: EM | Admit: 2018-09-13 | Discharge: 2018-09-13 | Disposition: A | Discharge status: Home / Self Care | Payer: Self-pay | Attending: Emergency Medicine | Admitting: Emergency Medicine

## 2018-09-13 ENCOUNTER — Other Ambulatory Visit: Payer: Self-pay

## 2018-09-13 DIAGNOSIS — Z87891 Personal history of nicotine dependence: Secondary | ICD-10-CM | POA: Insufficient documentation

## 2018-09-13 DIAGNOSIS — M25462 Effusion, left knee: Secondary | ICD-10-CM | POA: Insufficient documentation

## 2018-09-13 MED ORDER — IBUPROFEN 200 MG PO TABS
400.0000 mg | ORAL_TABLET | Freq: Once | ORAL | Status: AC
  Administered 2018-09-13: 400 mg via ORAL
  Filled 2018-09-13: qty 2

## 2018-09-13 MED ORDER — IBUPROFEN 800 MG PO TABS: 800.0000 mg | ORAL_TABLET | Freq: Three times a day (TID) | ORAL | 0 refills | Status: DC

## 2018-09-13 NOTE — ED Provider Notes (Signed)
Emergency Department Provider Note   I have reviewed the triage vital signs and the nursing notes.   HISTORY  Chief Complaint Knee Pain   HPI Ernestene MentionShamar R Shawnie Ponsratt is a 23 y.o. male who presents the emergency department with an acute exacerbation of his chronic left knee pain.  Patient dates had episodes over the last year or 2 where his knee will hurt.  Today he was walking and felt it locked up and then he felt a click and a sudden pain and then the knee became swollen rather quickly.  Somebody told him that the fluid could spread throughout his body she presents here for further evaluation.  Did not fall or hurt himself any other way.  No trauma recently.  No recent fevers or sicknesses.   No other associated or modifying symptoms.    Past Medical History:  Diagnosis Date  . Lung collapse   . STI (sexually transmitted infection) 05/16/2018    Patient Active Problem List   Diagnosis Date Noted  . 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 Primary spontaneous pneumothorax 10/24/2016    Past Surgical History:  Procedure Laterality Date  . CHEST TUBE INSERTION      Current Outpatient Rx  . Order #: [id number] Class: Print  . Order #: [id number] Class: Normal  . Order #: [id number] Class: Print    Allergies Other  History reviewed. No pertinent family history.  Social History Social History   Tobacco Use  . Smoking status: Former Smoker    Types: Cigars  . Smokeless tobacco: Never Used  Substance Use Topics  . Alcohol use: No  . Drug use: No    Review of Systems  All other systems negative except as documented in the HPI. All pertinent positives and negatives as reviewed in the HPI. ____________________________________________   PHYSICAL EXAM:  VITAL SIGNS: ED Triage Vitals  Enc Vitals Group     BP 09/13/18 0305 116/68     Pulse Rate 09/13/18 0305 89     Resp 09/13/18 0305 16     Temp 09/13/18 0305 98.2 F (36.8 C)     Temp Source 09/13/18 0305  Oral     SpO2 09/13/18 0305 99 %     Weight --      Height 09/13/18 0305 6' (1.829 m)    Constitutional: Alert and oriented. Well appearing and in no acute distress. Eyes: Conjunctivae are normal. PERRL. EOMI. Head: Atraumatic. Nose: No congestion/rhinnorhea. Mouth/Throat: Mucous membranes are moist.  Oropharynx non-erythematous. Neck: No stridor.  No meningeal signs.   Cardiovascular: Normal rate, regular rhythm. Good peripheral circulation. Grossly normal heart sounds.   Respiratory: Normal respiratory effort.  No retractions. Lungs CTAB. Gastrointestinal: Soft and nontender. No distention.  Musculoskeletal: no pain to left knee. Knee is stable. Moderate effusion. No erythema, warmth or pain with ROM or ttp Neurologic:  Normal speech and language. No gross focal neurologic deficits are appreciated.  Skin:  Skin is warm, dry and intact. No rash noted.   ____________________________________________   INITIAL IMPRESSION / ASSESSMENT AND PLAN / ED COURSE  Seems to be pretty stereotypical story for ligamentous versus meniscal injury.  No indication for imaging at this time as the patient likely just needs an MRI.  Will refer to orthopedics, crutches, ice, NSAIDs and Ace wrap.   Pertinent labs & imaging results that were available during my care of the patient were reviewed by me and considered in my medical decision making (see chart for details).  A medical screening  exam was performed and I feel the patient has had an appropriate workup for their chief complaint at this time and likelihood of emergent condition existing is low. They have been counseled on decision, discharge, follow up and which symptoms necessitate immediate return to the emergency department. They or their family verbally stated understanding and agreement with plan and discharged in stable condition.   ____________________________________________  FINAL CLINICAL IMPRESSION(S) / ED DIAGNOSES  Final diagnoses:   Knee effusion, left     MEDICATIONS GIVEN DURING THIS VISIT:  Medications  ibuprofen (ADVIL) tablet 400 mg (has no administration in time range)     NEW OUTPATIENT MEDICATIONS STARTED DURING THIS VISIT:  New Prescriptions   IBUPROFEN (ADVIL) 800 MG TABLET    Take 1 tablet (800 mg total) by mouth 3 (three) times daily.    Note:  This note was prepared with assistance of Dragon voice recognition software. Occasional wrong-word or sound-a-like substitutions may have occurred due to the inherent limitations of voice recognition software.   Kamalei Roeder, Corene Cornea, MD 09/13/18 [phone]

## 2018-09-13 NOTE — ED Triage Notes (Signed)
Patient is complaining of left knee and lower leg pain that started last year. Denies any recent injury.

## 2018-09-14 ENCOUNTER — Ambulatory Visit (HOSPITAL_COMMUNITY)
Admission: EM | Admit: 2018-09-14 | Discharge: 2018-09-14 | Disposition: A | Discharge status: Home / Self Care | Payer: Self-pay | Attending: Internal Medicine | Admitting: Internal Medicine

## 2018-09-14 ENCOUNTER — Other Ambulatory Visit: Payer: Self-pay

## 2018-09-14 ENCOUNTER — Encounter (HOSPITAL_COMMUNITY): Payer: Self-pay | Admitting: Emergency Medicine

## 2018-09-14 DIAGNOSIS — M25462 Effusion, left knee: Secondary | ICD-10-CM

## 2018-09-14 MED ORDER — NAPROXEN 375 MG PO TABS: 375.0000 mg | ORAL_TABLET | Freq: Two times a day (BID) | ORAL | 0 refills | Status: DC

## 2018-09-14 MED ORDER — DEXAMETHASONE SODIUM PHOSPHATE 10 MG/ML IJ SOLN
INTRAMUSCULAR | Status: AC
  Filled 2018-09-14: qty 1

## 2018-09-14 MED ORDER — DEXAMETHASONE SODIUM PHOSPHATE 10 MG/ML IJ SOLN: 10.0000 mg | Freq: Once | INTRAMUSCULAR | Status: DC

## 2018-09-14 MED ORDER — DEXAMETHASONE SODIUM PHOSPHATE 10 MG/ML IJ SOLN
10.0000 mg | Freq: Once | INTRAMUSCULAR | Status: AC
  Administered 2018-09-14: 10 mg via INTRAMUSCULAR

## 2018-09-14 NOTE — ED Triage Notes (Signed)
Pt sts left knee pain and swelling; pt sts hx of same after getting "cramp" in leg and then having pain and swelling

## 2018-09-14 NOTE — ED Provider Notes (Signed)
[facility]    CSN: [id number] Arrival date & time: 09/14/18  1918      History   Chief Complaint Chief Complaint  Patient presents with  . Knee Pain    HPI Daniel Higgins is a 23 y.o. male with history of recurrent left knee effusion comes to urgent care complaining of increasing left knee pain with effusion over the past day or so.  Patient says that he has had this in the past.  Pain is constant of moderate severity.  Aggravated by movement around the knee joint.  No known relieving factors.  Is associated with cramping sensation in the thigh.  No fever or chills.  No erythema around the knee joints.  No differential warmth.  Patient used to play sports and denies any significant injury.  Review of medical records indicates that he may have had left knee injury several years ago.  No recent injury to the knee HPI  Past Medical History:  Diagnosis Date  . Lung collapse   . STI (sexually transmitted infection) 05/16/2018    Patient Active Problem List   Diagnosis Date Noted  . 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 Primary spontaneous pneumothorax 10/24/2016    Past Surgical History:  Procedure Laterality Date  . CHEST TUBE INSERTION         Home Medications    Prior to Admission medications   Medication Sig Start Date End Date Taking? Authorizing Provider  fluticasone (FLONASE) 50 MCG/ACT nasal spray Place 2 sprays into both nostrils daily. 06/14/18   Loren RacerYelverton, David, MD  loratadine (CLARITIN) 10 MG tablet Take 1 tablet (10 mg total) by mouth daily. 06/14/18   Loren RacerYelverton, David, MD  naproxen (NAPROSYN) 375 MG tablet Take 1 tablet (375 mg total) by mouth 2 (two) times daily. 09/14/18   Cristine Daw, Britta MccreedyPhilip O, MD    Family History History reviewed. No pertinent family history.  Social History Social History   Tobacco Use  . Smoking status: Former Smoker    Types: Cigars  . Smokeless tobacco: Never Used  Substance Use Topics  . Alcohol use:  No  . Drug use: No     Allergies   Other   Review of Systems Review of Systems  Constitutional: Positive for activity change. Negative for appetite change, chills, fatigue and fever.  HENT: Negative.   Respiratory: Negative.   Gastrointestinal: Negative.   Genitourinary: Negative.   Musculoskeletal: Positive for arthralgias, gait problem, joint swelling and myalgias. Negative for back pain, neck pain and neck stiffness.  Skin: Negative for rash and wound.  Neurological: Negative for dizziness, weakness, numbness and headaches.     Physical Exam Triage Vital Signs ED Triage Vitals  Enc Vitals Group     BP 09/14/18 2005 107/64     Pulse Rate 09/14/18 2005 76     Resp 09/14/18 2005 18     Temp 09/14/18 2005 98.5 F (36.9 C)     Temp Source 09/14/18 2005 Oral     SpO2 09/14/18 2005 100 %     Weight --      Height --      Head Circumference --      Peak Flow --      Pain Score 09/14/18 2006 7     Pain Loc --      Pain Edu? --      Excl. in GC? --    No data found.  Updated Vital Signs BP 107/64 (BP Location: Right Arm)  Pulse 76   Temp 98.5 F (36.9 C) (Oral)   Resp 18   SpO2 100%   Visual Acuity Right Eye Distance:   Left Eye Distance:   Bilateral Distance:    Right Eye Near:   Left Eye Near:    Bilateral Near:     Physical Exam Vitals signs and nursing note reviewed.  Constitutional:      Appearance: Normal appearance.  Cardiovascular:     Pulses: Normal pulses.     Heart sounds: Normal heart sounds.  Pulmonary:     Effort: Pulmonary effort is normal.     Breath sounds: Normal breath sounds.  Abdominal:     General: Abdomen is flat. Bowel sounds are normal.  Musculoskeletal:        General: Swelling and tenderness present. No deformity or signs of injury.     Comments: Left knee swelling with ballotable patella.  No differential warmth.  Range of motion is limited by pain.  Skin:    General: Skin is warm.     Capillary Refill: Capillary  refill takes less than 2 seconds.     Coloration: Skin is not jaundiced.     Findings: No bruising, erythema, lesion or rash.  Neurological:     General: No focal deficit present.     Mental Status: He is alert.      UC Treatments / Results  Labs (all labs ordered are listed, but only abnormal results are displayed) Labs Reviewed - No data to display  EKG   Radiology No results found.  Procedures Procedures (including critical care time)  Medications Ordered in UC Medications  dexamethasone (DECADRON) injection 10 mg (has no administration in time range)  dexamethasone (DECADRON) 10 MG/ML injection (has no administration in time range)    Initial Impression / Assessment and Plan / UC Course  I have reviewed the triage vital signs and the nursing notes.  Pertinent labs & imaging results that were available during my care of the patient were reviewed by me and considered in my medical decision making (see chart for details).     1.  Recurrent left knee swelling likely prior meniscal/ligament injury: Decadron IM 10 mg x 1 Naproxen 375 mg twice daily Ace wrap Icing of the knee Gentle range of motion Patient already has crutches that he uses Final Clinical Impressions(s) / UC Diagnoses   Final diagnoses:  Effusion of left knee joint   Discharge Instructions   None    ED Prescriptions    Medication Sig Dispense Auth. Provider   naproxen (NAPROSYN) 375 MG tablet Take 1 tablet (375 mg total) by mouth 2 (two) times daily. 20 tablet Carnesha Maravilla, Myrene Galas, MD     Controlled Substance Prescriptions [place] Controlled Substance Registry consulted? No   Chase Picket, MD 09/14/18 2027

## 2019-02-10 ENCOUNTER — Emergency Department (HOSPITAL_COMMUNITY): Payer: Self-pay

## 2019-02-10 ENCOUNTER — Other Ambulatory Visit: Payer: Self-pay

## 2019-02-10 ENCOUNTER — Emergency Department (HOSPITAL_COMMUNITY)
Admission: EM | Admit: 2019-02-10 | Discharge: 2019-02-10 | Disposition: A | Discharge status: Home / Self Care | Payer: Self-pay | Attending: Emergency Medicine | Admitting: Emergency Medicine

## 2019-02-10 ENCOUNTER — Encounter (HOSPITAL_COMMUNITY): Payer: Self-pay | Admitting: *Deleted

## 2019-02-10 DIAGNOSIS — Z87891 Personal history of nicotine dependence: Secondary | ICD-10-CM | POA: Insufficient documentation

## 2019-02-10 DIAGNOSIS — R0602 Shortness of breath: Secondary | ICD-10-CM | POA: Insufficient documentation

## 2019-02-10 DIAGNOSIS — R0789 Other chest pain: Secondary | ICD-10-CM | POA: Insufficient documentation

## 2019-02-10 DIAGNOSIS — Z79899 Other long term (current) drug therapy: Secondary | ICD-10-CM | POA: Insufficient documentation

## 2019-02-10 MED ORDER — KETOROLAC TROMETHAMINE 30 MG/ML IJ SOLN
30.0000 mg | Freq: Once | INTRAMUSCULAR | Status: AC
  Administered 2019-02-10: 08:00:00 30 mg via INTRAMUSCULAR
  Filled 2019-02-10: qty 1

## 2019-02-10 MED ORDER — MELOXICAM 7.5 MG PO TABS: 7.5000 mg | ORAL_TABLET | Freq: Every day | ORAL | 0 refills | Status: DC

## 2019-02-10 NOTE — Discharge Instructions (Addendum)
I suspect you have pleurisy or chest pain.  Less likely, you may have pericarditis considering he had a fever possible viral syndrome last week.  Both of these are treated with anti-inflammatory medicine.  Take Mobic once daily as prescribed.  You can alternate with Tylenol as prescribed over-the-counter as needed for breakthrough pain.  Please follow-up with your primary care provider or cardiologist if symptoms continue.  Please return the emergency department if you develop any new or worsening symptoms.

## 2019-02-10 NOTE — ED Triage Notes (Signed)
Pt c/o L sided chest/ lung pain x 4 days. Denies trauma, no coughing. Hx of spontaneous pneumothorax in 2018

## 2019-02-10 NOTE — ED Provider Notes (Signed)
[facility] EMERGENCY DEPARTMENT Provider Note   CSN: [id number] Arrival date & time: 02/10/19  1062     History Chief Complaint  Patient presents with  . Chest Pain    Daniel Higgins is a 23 y.o. male with history of left spontaneous pneumothorax 2 years ago who presents with a 4-day history of left-sided chest pain.  He describes it as sharp.  It is worse with laying back and on his side.  He has had some pleuritic symptoms.  His pain wraps around to his back at times.  He denies any cough.  He did have a fever up to 103 last week that resolved on its own.  He denies any current fever.  He has had some intermittent shortness of breath.  He denies any abdominal pain, nausea, vomiting.  Patient denies any drug use, including cocaine.  He denies any recent long trips, surgeries, known cancer, history of blood clots, new calf pain or swelling  HPI     Past Medical History:  Diagnosis Date  . Lung collapse   . STI (sexually transmitted infection) 05/16/2018    Patient Active Problem List   Diagnosis Date Noted  . 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 Primary spontaneous pneumothorax 10/24/2016    Past Surgical History:  Procedure Laterality Date  . CHEST TUBE INSERTION         No family history on file.  Social History   Tobacco Use  . Smoking status: Former Smoker    Types: Cigars  . Smokeless tobacco: Never Used  Substance Use Topics  . Alcohol use: No  . Drug use: No    Home Medications Prior to Admission medications   Medication Sig Start Date End Date Taking? Authorizing Provider  fluticasone (FLONASE) 50 MCG/ACT nasal spray Place 2 sprays into both nostrils daily. Patient not taking: Reported on 02/10/2019 06/14/18   Julianne Rice, MD  loratadine (CLARITIN) 10 MG tablet Take 1 tablet (10 mg total) by mouth daily. Patient not taking: Reported on 02/10/2019 06/14/18   Julianne Rice, MD  meloxicam (MOBIC) 7.5 MG tablet  Take 1 tablet (7.5 mg total) by mouth daily. 02/10/19   Eulice Rutledge, Bea Graff, PA-C  naproxen (NAPROSYN) 375 MG tablet Take 1 tablet (375 mg total) by mouth 2 (two) times daily. Patient not taking: Reported on 02/10/2019 09/14/18   Chase Picket, MD    Allergies    Other  Review of Systems   Review of Systems  Constitutional: Negative for chills and fever.  HENT: Negative for facial swelling and sore throat.   Respiratory: Positive for shortness of breath. Negative for cough.   Cardiovascular: Positive for chest pain. Negative for leg swelling.  Gastrointestinal: Negative for abdominal pain, nausea and vomiting.  Genitourinary: Negative for dysuria.  Musculoskeletal: Negative for back pain.  Skin: Negative for rash and wound.  Neurological: Negative for headaches.  Psychiatric/Behavioral: The patient is not nervous/anxious.     Physical Exam Updated Vital Signs BP 116/74 (BP Location: Right Arm)   Pulse 67   Temp 98.7 F (37.1 C) (Oral)   Resp 18   SpO2 99%   Physical Exam Vitals and nursing note reviewed.  Constitutional:      General: He is not in acute distress.    Appearance: He is well-developed. He is not diaphoretic.  HENT:     Head: Normocephalic and atraumatic.     Mouth/Throat:     Pharynx: No oropharyngeal exudate.  Eyes:  General: No scleral icterus.       Right eye: No discharge.        Left eye: No discharge.     Conjunctiva/sclera: Conjunctivae normal.     Pupils: Pupils are equal, round, and reactive to light.  Neck:     Thyroid: No thyromegaly.  Cardiovascular:     Rate and Rhythm: Normal rate and regular rhythm.     Heart sounds: Normal heart sounds. No murmur. No friction rub. No gallop.   Pulmonary:     Effort: Pulmonary effort is normal. No respiratory distress.     Breath sounds: Normal breath sounds. No stridor. No wheezing or rales.  Chest:     Chest wall: Tenderness present.    Abdominal:     General: Bowel sounds are normal. There  is no distension.     Palpations: Abdomen is soft.     Tenderness: There is no abdominal tenderness. There is no guarding or rebound.  Musculoskeletal:     Cervical back: Normal range of motion and neck supple.     Right lower leg: No tenderness.     Left lower leg: No tenderness.     Comments: Chronic, mild left knee effusion; no warmth or erythema; some lateral joint line tenderness; full passive range of motion without significant pain  Lymphadenopathy:     Cervical: No cervical adenopathy.  Skin:    General: Skin is warm and dry.     Coloration: Skin is not pale.     Findings: No rash.  Neurological:     Mental Status: He is alert.     Coordination: Coordination normal.     ED Results / Procedures / Treatments   Labs (all labs ordered are listed, but only abnormal results are displayed) Labs Reviewed - No data to display  EKG EKG Interpretation  Date/Time:  Saturday February 10 2019 05:37:58 EST Ventricular Rate:  98 PR Interval:  140 QRS Duration: 88 QT Interval:  324 QTC Calculation: 413 R Axis:   88 Text Interpretation: Normal sinus rhythm Possible Left atrial enlargement Borderline ECG No significant change since last tracing Confirmed by Drema Pryardama, Pedro [phone]([id number]) on 02/10/2019 6:55:29 AM   Radiology DG Chest Portable 1 View  Result Date: 02/10/2019 CLINICAL DATA:  Pt c/o L sided chest/ lung pain x 4 days. Denies trauma, no coughing. Hx of spontaneous pneumothorax in 2018chest pain EXAM: PORTABLE CHEST 1 VIEW COMPARISON:  Chest 08/10/2018 FINDINGS: Normal mediastinum and cardiac silhouette. Normal pulmonary vasculature. No evidence of effusion, infiltrate, or pneumothorax. No acute bony abnormality. IMPRESSION: Normal chest radiograph. Electronically Signed   By: Genevive BiStewart  Edmunds M.D.   On: 02/10/2019 06:44    Procedures Procedures (including critical care time)  Medications Ordered in ED Medications  ketorolac (TORADOL) 30 MG/ML injection 30 mg (30 mg  Intramuscular Given 02/10/19 0745)    ED Course  I have reviewed the triage vital signs and the nursing notes.  Pertinent labs & imaging results that were available during my care of the patient were reviewed by me and considered in my medical decision making (see chart for details).    MDM Rules/Calculators/A&P          Patient presenting with left-sided sharp chest pain.  It is pleuritic at times.  Patient is PERC negative.  Low suspicion of ACS.  It is reproducible on palpation.  Suspect pleurisy versus pericarditis.  No evidence of pericarditis on EKG.  His vitals are stable.  He is much improved after  IM Toradol in the ED.  Will discharge home on Mobic.  Breakthrough pain treated with Tylenol.  Follow-up to ECP and/or cardiology if symptoms are continuing.  Return precautions discussed.  Patient vitals stable throughout ED course and discharged in satisfactory condition. I discussed patient case with Dr. Eudelia Bunch who guided the patient's management and agrees with plan.   Final Clinical Impression(s) / ED Diagnoses Final diagnoses:  Atypical chest pain    Rx / DC Orders ED Discharge Orders         Ordered    meloxicam (MOBIC) 7.5 MG tablet  Daily     02/10/19 0831           Emi Holes, PA-C 02/10/19 0834    Nira Conn, MD 02/11/19 [phone]

## 2019-02-10 NOTE — ED Notes (Signed)
Wallet with $20 cash and various cards found on bed after pt was discharged.  Pt was called and is on his way to pick up wallet.

## 2019-02-10 NOTE — ED Notes (Signed)
[place] (girlfriend), contact when pt is back in room

## 2019-02-15 ENCOUNTER — Other Ambulatory Visit: Payer: Self-pay

## 2019-02-15 ENCOUNTER — Emergency Department (HOSPITAL_COMMUNITY): Payer: Self-pay

## 2019-02-15 ENCOUNTER — Encounter (HOSPITAL_COMMUNITY): Payer: Self-pay | Admitting: *Deleted

## 2019-02-15 ENCOUNTER — Emergency Department (HOSPITAL_COMMUNITY)
Admission: EM | Admit: 2019-02-15 | Discharge: 2019-02-15 | Disposition: A | Discharge status: Home / Self Care | Payer: Self-pay | Attending: Emergency Medicine | Admitting: Emergency Medicine

## 2019-02-15 DIAGNOSIS — Z72820 Sleep deprivation: Secondary | ICD-10-CM | POA: Insufficient documentation

## 2019-02-15 DIAGNOSIS — R002 Palpitations: Secondary | ICD-10-CM | POA: Insufficient documentation

## 2019-02-15 DIAGNOSIS — Z87891 Personal history of nicotine dependence: Secondary | ICD-10-CM | POA: Insufficient documentation

## 2019-02-15 DIAGNOSIS — Z733 Stress, not elsewhere classified: Secondary | ICD-10-CM | POA: Insufficient documentation

## 2019-02-15 LAB — BASIC METABOLIC PANEL
Anion gap: 8 (ref 5–15)
BUN: 6 mg/dL (ref 6–20)
CO2: 30 mmol/L (ref 22–32)
Calcium: 9.6 mg/dL (ref 8.9–10.3)
Chloride: 101 mmol/L (ref 98–111)
Creatinine, Ser: 0.7 mg/dL (ref 0.61–1.24)
GFR calc Af Amer: >60 mL/min (ref >60)
GFR calc non Af Amer: >60 mL/min (ref >60)
Glucose, Bld: 87 mg/dL (ref 70–99)
Potassium: 3.7 mmol/L (ref 3.5–5.1)
Sodium: 139 mmol/L (ref 135–145)

## 2019-02-15 LAB — CBC
HCT: 43.4 % (ref 39.0–52.0)
Hemoglobin: 13.8 g/dL (ref 13.0–17.0)
MCH: 27.2 pg (ref 26.0–34.0)
MCHC: 31.8 g/dL (ref 30.0–36.0)
MCV: 85.6 fL (ref 80.0–100.0)
Platelets: 364 10*3/uL (ref 150–400)
RBC: 5.07 MIL/uL (ref 4.22–5.81)
RDW: 14.1 % (ref 11.5–15.5)
WBC: 8.5 10*3/uL (ref 4.0–10.5)
nRBC: 0 % (ref 0.0–0.2)

## 2019-02-15 NOTE — ED Triage Notes (Signed)
States he has woke up and was lying in the bed and states his heart felt like it was  Skipping denies chest pain

## 2019-02-15 NOTE — Discharge Instructions (Addendum)
You have been diagnosed today with Palpations.  At this time there does not appear to be the presence of an emergent medical condition, however there is always the potential for conditions to change. Please read and follow the below instructions.  Please return to the Emergency Department immediately for any new or worsening symptoms. Please be sure to follow up with your Primary Care Provider within one week regarding your visit today; please call their office to schedule an appointment even if you are feeling better for a follow-up visit.  If you do not have a primary care provider you may call [facility] to establish one. Please drink plenty of water and get plenty of rest.  Get help right away if you: Have chest pain. Feel short of breath. Have a very bad headache. Feel dizzy. Pass out (faint). You have any new/concerning or worsening of symptoms.  Please read the additional information packets attached to your discharge summary.  Do not take your medicine if  develop an itchy rash, swelling in your mouth or lips, or difficulty breathing; call 911 and seek immediate emergency medical attention if this occurs.  Note: Portions of this text may have been transcribed using voice recognition software. Every effort was made to ensure accuracy; however, inadvertent computerized transcription errors may still be present.

## 2019-02-15 NOTE — ED Notes (Signed)
Patient verbalizes understanding of discharge instructions. Opportunity for questioning and answers were provided. Armband removed by staff, pt discharged from ED.  

## 2019-02-15 NOTE — ED Provider Notes (Signed)
[facility]   CSN: [id number] Arrival date & time: 02/15/19  0456     History Chief Complaint  Patient presents with  . Palpitations    Daniel Higgins is a 23 y.o. male patient presents today for palpitations.  He reports that around 3-4 AM he was sitting on his couch when he had a feeling of his heart "skipping".  He reports he felt it skip close to 7-8 times over a few minutes.  He reports this has happened to him in the past but not in several weeks to months.  He denies any associated pain with his palpitations.  He reports that they resolved quickly and have not reoccurred.  He attributes his palpitations to increasing stress and not getting enough sleep but he checked into the ED today for evaluation of his heart.  Patient has history of pneumothorax otherwise healthy.  He denies any family history of sudden cardiac death or arrhythmias.  Patient denies any recent illness, fever/chills, fall/injury, headache/vision changes, neck pain, chest pain/shortness of breath, cough/hemoptysis, abdominal pain, nausea/vomiting, diaphoresis, numbness/weakness, tingling, drug/alcohol use or any additional concerns.  HPI     Past Medical History:  Diagnosis Date  . Lung collapse   . STI (sexually transmitted infection) 05/16/2018    Patient Active Problem List   Diagnosis Date Noted  . 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 Primary spontaneous pneumothorax 10/24/2016    Past Surgical History:  Procedure Laterality Date  . CHEST TUBE INSERTION         No family history on file.  Social History   Tobacco Use  . Smoking status: Former Smoker    Types: Cigars  . Smokeless tobacco: Never Used  Substance Use Topics  . Alcohol use: No  . Drug use: No    Home Medications Prior to Admission medications   Medication Sig Start Date End Date Taking? Authorizing Provider  fluticasone (FLONASE) 50 MCG/ACT nasal spray  Place 2 sprays into both nostrils daily. Patient not taking: Reported on 02/10/2019 06/14/18   Loren RacerYelverton, David, MD  loratadine (CLARITIN) 10 MG tablet Take 1 tablet (10 mg total) by mouth daily. Patient not taking: Reported on 02/10/2019 06/14/18   Loren RacerYelverton, David, MD  meloxicam (MOBIC) 7.5 MG tablet Take 1 tablet (7.5 mg total) by mouth daily. 02/10/19   Law, Waylan BogaAlexandra M, PA-C  naproxen (NAPROSYN) 375 MG tablet Take 1 tablet (375 mg total) by mouth 2 (two) times daily. Patient not taking: Reported on 02/10/2019 09/14/18   Merrilee JanskyLamptey, Philip O, MD    Allergies    Other  Review of Systems   Review of Systems Ten systems are reviewed and are negative for acute change except as noted in the HPI  Physical Exam Updated Vital Signs BP 105/77 (BP Location: Right Arm)   Pulse 85   Temp 98.6 F (37 C) (Oral)   Resp 16   Ht 5\' 9"  (1.753 m)   Wt 55.8 kg   SpO2 100%   BMI 18.16 kg/m   Physical Exam Constitutional:      General: He is not in acute distress.    Appearance: Normal appearance. He is well-developed. He is not ill-appearing or diaphoretic.  HENT:     Head: Normocephalic and atraumatic.     Right Ear: External ear normal.     Left Ear: External ear normal.     Nose: Nose normal.  Eyes:     General: Vision grossly intact. Gaze  aligned appropriately.     Pupils: Pupils are equal, round, and reactive to light.  Neck:     Trachea: Trachea and phonation normal. No tracheal deviation.  Cardiovascular:     Rate and Rhythm: Normal rate and regular rhythm.     Pulses: Normal pulses.     Heart sounds: Normal heart sounds.  Pulmonary:     Effort: Pulmonary effort is normal. No respiratory distress.     Breath sounds: Normal breath sounds.  Abdominal:     General: There is no distension.     Palpations: Abdomen is soft.     Tenderness: There is no abdominal tenderness. There is no guarding or rebound.  Musculoskeletal:        General: Normal range of motion.     Cervical back:  Normal range of motion.  Skin:    General: Skin is warm and dry.  Neurological:     Mental Status: He is alert.     GCS: GCS eye subscore is 4. GCS verbal subscore is 5. GCS motor subscore is 6.     Comments: Speech is clear and goal oriented, follows commands Major Cranial nerves without deficit, no facial droop Moves extremities without ataxia, coordination intact  Psychiatric:        Behavior: Behavior normal.     ED Results / Procedures / Treatments   Labs (all labs ordered are listed, but only abnormal results are displayed) Labs Reviewed  BASIC METABOLIC PANEL  CBC    EKG EKG Interpretation  Date/Time:  Thursday February 15 2019 05:23:16 EST Ventricular Rate:  89 PR Interval:  136 QRS Duration: 88 QT Interval:  328 QTC Calculation: 399 R Axis:   84 Text Interpretation: Normal sinus rhythm Nonspecific T wave abnormality Abnormal ECG Confirmed by Lennice Sites [phone]) on 02/15/2019 7:12:27 AM Also confirmed by Lennice Sites [phone])  on 02/15/2019 8:21:57 AM   Radiology DG Chest 2 View  Result Date: 02/15/2019 CLINICAL DATA:  Palpitations EXAM: CHEST - 2 VIEW COMPARISON:  Five days ago FINDINGS: Normal heart size and mediastinal contours. No acute infiltrate or edema. No effusion or pneumothorax. No acute osseous findings. IMPRESSION: Negative chest Electronically Signed   By: Monte Fantasia M.D.   On: 02/15/2019 06:12    Procedures Procedures (including critical care time)  Medications Ordered in ED Medications - No data to display  ED Course  I have reviewed the triage vital signs and the nursing notes.  Pertinent labs & imaging results that were available during my care of the patient were reviewed by me and considered in my medical decision making (see chart for details).    MDM Rules/Calculators/A&P                      CBC within normal limits BMP within normal limits Chest x-ray:  IMPRESSION:  Negative chest   EKG: Normal sinus rhythm  Nonspecific T wave abnormality Abnormal ECG Confirmed by Lennice Sites [phone]) on 02/15/2019 7:12:27 AM Also confirmed by Lennice Sites [phone])  on 02/15/2019 8:21:57 AM - Vital signs stable no fever, hypoxia, hypotension, tachypnea or tachycardia, on room air.  He is well-appearing and in no acute distress.  Denies any chest pain or shortness of breath.  He denies any family history of sudden cardiac death or arrhythmias.  He attributes his palpitations today from not getting of sleep and increasing stress.  He denies any drug or alcohol use.  These palpitations have resolved and have not recurred  in the last 4-5 hours per patient.  He is requesting discharge.  Cranial nerves intact, no meningeal signs, heart regular rate and rhythm without murmur, lungs clear bilaterally, abdomen soft nontender, neurovascular intact to all 4 extremities without evidence of DVT.  Doubt valvular abnormality, arrhythmia, ACS, PE, dissection or any other emergent cardiopulmonary etiology of patient's symptoms today.  I have advised patient to call his primary care doctor today to schedule a follow-up appointment and for reevaluation, he is aware to return to the emergency department for any new or worsening symptoms.  Advised to drink water and get rest.  At this time there does not appear to be any evidence of an acute emergency medical condition and the patient appears stable for discharge with appropriate outpatient follow up. Diagnosis was discussed with patient who verbalizes understanding of care plan and is agreeable to discharge. I have discussed return precautions with patient who verbalizes understanding of return precautions. Patient encouraged to follow-up with their PCP. All questions answered.  Patient has been discharged in good condition.  Patient's case discussed with Dr. Lockie Mola who agrees with plan to discharge with follow-up.   Note: Portions of this report may have been transcribed using voice  recognition software. Every effort was made to ensure accuracy; however, inadvertent computerized transcription errors may still be present. Final Clinical Impression(s) / ED Diagnoses Final diagnoses:  Palpitations    Rx / DC Orders ED Discharge Orders    None       Bill Salinas, PA-C 02/15/19 9983    Virgina Norfolk, DO 02/15/19 1556

## 2019-02-17 ENCOUNTER — Encounter (HOSPITAL_COMMUNITY): Payer: Self-pay | Admitting: Emergency Medicine

## 2019-02-17 ENCOUNTER — Emergency Department (HOSPITAL_COMMUNITY)
Admission: EM | Admit: 2019-02-17 | Discharge: 2019-02-17 | Disposition: A | Discharge status: Home / Self Care | Payer: Self-pay | Attending: Emergency Medicine | Admitting: Emergency Medicine

## 2019-02-17 ENCOUNTER — Other Ambulatory Visit: Payer: Self-pay

## 2019-02-17 DIAGNOSIS — Z202 Contact with and (suspected) exposure to infections with a predominantly sexual mode of transmission: Secondary | ICD-10-CM | POA: Insufficient documentation

## 2019-02-17 DIAGNOSIS — Z87891 Personal history of nicotine dependence: Secondary | ICD-10-CM | POA: Insufficient documentation

## 2019-02-17 DIAGNOSIS — R300 Dysuria: Secondary | ICD-10-CM | POA: Insufficient documentation

## 2019-02-17 DIAGNOSIS — Z79899 Other long term (current) drug therapy: Secondary | ICD-10-CM | POA: Insufficient documentation

## 2019-02-17 LAB — URINALYSIS, ROUTINE W REFLEX MICROSCOPIC
Bilirubin Urine: NEGATIVE
Glucose, UA: NEGATIVE mg/dL
Hgb urine dipstick: NEGATIVE
Ketones, ur: NEGATIVE mg/dL
Leukocytes,Ua: NEGATIVE
Nitrite: NEGATIVE
Protein, ur: NEGATIVE mg/dL
Specific Gravity, Urine: 1.018 (ref 1.005–1.030)
pH: 7 (ref 5.0–8.0)

## 2019-02-17 MED ORDER — CEFTRIAXONE SODIUM 250 MG IJ SOLR
250.0000 mg | Freq: Once | INTRAMUSCULAR | Status: AC
  Administered 2019-02-17: 250 mg via INTRAMUSCULAR
  Filled 2019-02-17: qty 250

## 2019-02-17 MED ORDER — STERILE WATER FOR INJECTION IJ SOLN
INTRAMUSCULAR | Status: AC
  Filled 2019-02-17: qty 10

## 2019-02-17 MED ORDER — AZITHROMYCIN 250 MG PO TABS
1000.0000 mg | ORAL_TABLET | Freq: Once | ORAL | Status: AC
  Administered 2019-02-17: 1000 mg via ORAL
  Filled 2019-02-17: qty 4

## 2019-02-17 NOTE — ED Triage Notes (Signed)
Pt states that he has had burning when he urinates x 2 days. Denies discharge. Endorses unprotected sex. Alert and oriented.

## 2019-02-17 NOTE — ED Provider Notes (Signed)
[facility]-EMERGENCY DEPT Provider Note   CSN: [id number] Arrival date & time: 02/17/19  1148     History Chief Complaint  Patient presents with  . Dysuria    Benaiah DEERIC CRUISE is a 23 y.o. male.  23 y.o male with a PMH of STI presents to the ED with a chief complaint of dysuria x 2 days. Patient reports he was sexually active 3 days ago, unprotected encounter with an unknown partner.  He reports his symptoms began yesterday, they are consistent with dysuria.  He has not tried any medication to help with his symptoms.  He does have a previous history of chlamydia, reports this feels somewhat similar to on today's visit.  He denies any rashes, testicular swelling, penile pain.  No penile discharge for patient.  The history is provided by the patient and medical records.  Dysuria Presenting symptoms: dysuria   Presenting symptoms: no penile discharge and no penile pain   Associated symptoms: no abdominal pain, no fever and no penile swelling        Past Medical History:  Diagnosis Date  . Lung collapse   . STI (sexually transmitted infection) 05/16/2018    Patient Active Problem List   Diagnosis Date Noted  . 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 Primary spontaneous pneumothorax 10/24/2016    Past Surgical History:  Procedure Laterality Date  . CHEST TUBE INSERTION         History reviewed. No pertinent family history.  Social History   Tobacco Use  . Smoking status: Former Smoker    Types: Cigars  . Smokeless tobacco: Never Used  Substance Use Topics  . Alcohol use: No  . Drug use: No    Home Medications Prior to Admission medications   Medication Sig Start Date End Date Taking? Authorizing Provider  fluticasone (FLONASE) 50 MCG/ACT nasal spray Place 2 sprays into both nostrils daily. Patient not taking: Reported on 02/10/2019 06/14/18   Loren Racer, MD  loratadine (CLARITIN) 10 MG tablet Take 1 tablet (10 mg total)  by mouth daily. Patient not taking: Reported on 02/10/2019 06/14/18   Loren Racer, MD  meloxicam (MOBIC) 7.5 MG tablet Take 1 tablet (7.5 mg total) by mouth daily. 02/10/19   Law, Waylan Boga, PA-C  naproxen (NAPROSYN) 375 MG tablet Take 1 tablet (375 mg total) by mouth 2 (two) times daily. Patient not taking: Reported on 02/10/2019 09/14/18   Merrilee Jansky, MD    Allergies    Other  Review of Systems   Review of Systems  Constitutional: Negative for fever.  Gastrointestinal: Negative for abdominal pain.  Genitourinary: Positive for dysuria. Negative for discharge, penile pain and penile swelling.    Physical Exam Updated Vital Signs BP (!) 110/59 (BP Location: Left Arm)   Pulse 84   Temp 98.3 F (36.8 C) (Oral)   Resp 16   Ht 5\' 9"  (1.753 m)   SpO2 99%   BMI 18.16 kg/m   Physical Exam Vitals and nursing note reviewed.  Constitutional:      Appearance: Normal appearance. He is not ill-appearing.  HENT:     Head: Normocephalic and atraumatic.     Mouth/Throat:     Mouth: Mucous membranes are moist.  Cardiovascular:     Rate and Rhythm: Normal rate.  Abdominal:     General: Abdomen is flat.  Genitourinary:    Comments: Patient deferred. No rash per pt.  Skin:    General: Skin is warm and dry.  Neurological:     Mental Status: He is alert and oriented to person, place, and time.     ED Results / Procedures / Treatments   Labs (all labs ordered are listed, but only abnormal results are displayed) Labs Reviewed  URINALYSIS, ROUTINE W REFLEX MICROSCOPIC - Abnormal; Notable for the following components:      Result Value   APPearance CLOUDY (*)    All other components within normal limits  GC/CHLAMYDIA PROBE AMP ([place]) NOT AT [facility]    EKG None  Radiology No results found.  Procedures Procedures (including critical care time)  Medications Ordered in ED Medications  azithromycin (ZITHROMAX) tablet 1,000 mg (has no administration in time  range)  cefTRIAXone (ROCEPHIN) injection 250 mg (has no administration in time range)    ED Course  I have reviewed the triage vital signs and the nursing notes.  Pertinent labs & imaging results that were available during my care of the patient were reviewed by me and considered in my medical decision making (see chart for details).    MDM Rules/Calculators/A&P     Patient with a past medical history of chlamydia presents to the ED with complaints of dysuria for the past 2 days.  He was last sexually active 3 days ago, symptoms began shortly after.  He denies any penile discharge, testicular swelling, testicular pain, penile pain or abdominal pain.  Denies any gross blood in his urine.  Has no back pain, fevers.  Patient deferred GU exam, will add GC testing onto urine.  Denies any rashes, vesicular lesions, ulcerations to his penis or groin region.  He is otherwise non-ill-appearing, nontoxic.  Vitals are within normal limits.  UA with some cloudy appearance, no nitrates, leukocytes.  GC has been added to his urine but will not return today.  Patient is opting for treatment at this time with azithromycin and Rocephin..  Will advise him to refrain from sexual intercourse for the next 10 days.  He understands and agrees with management.  Return precautions discussed at length.   Portions of this note were generated with [employer]. Dictation errors may occur despite best attempts at proofreading.  Final Clinical Impression(s) / ED Diagnoses Final diagnoses:  Dysuria  STD exposure    Rx / DC Orders ED Discharge Orders    None       Janeece Fitting, PA-C 02/17/19 [place], MD 02/18/19 1240

## 2019-02-17 NOTE — ED Notes (Signed)
An After Visit Summary was printed and given to the patient. Discharge instructions given and no further questions at this time.  

## 2019-02-17 NOTE — Discharge Instructions (Signed)
Today you were treated for gonorrhea chlamydia, please refrain from sexual intercourse for the next 10 days.  Please note to the health department for your future STI screening.

## 2019-09-03 ENCOUNTER — Emergency Department (HOSPITAL_COMMUNITY): Payer: Self-pay

## 2019-09-03 ENCOUNTER — Other Ambulatory Visit: Payer: Self-pay

## 2019-09-03 ENCOUNTER — Encounter (HOSPITAL_COMMUNITY): Payer: Self-pay

## 2019-09-03 ENCOUNTER — Emergency Department (HOSPITAL_COMMUNITY)
Admission: EM | Admit: 2019-09-03 | Discharge: 2019-09-04 | Disposition: A | Discharge status: Home / Self Care | Payer: Self-pay | Attending: Emergency Medicine | Admitting: Emergency Medicine

## 2019-09-03 DIAGNOSIS — R0781 Pleurodynia: Secondary | ICD-10-CM | POA: Insufficient documentation

## 2019-09-03 DIAGNOSIS — Z5321 Procedure and treatment not carried out due to patient leaving prior to being seen by health care provider: Secondary | ICD-10-CM | POA: Insufficient documentation

## 2019-09-03 NOTE — ED Triage Notes (Signed)
Pt sts running 2315 and fell. Left rib pain

## 2019-09-04 NOTE — ED Notes (Signed)
Pt eloped from waiting area. Called 3X for room placement.  

## 2019-10-15 ENCOUNTER — Other Ambulatory Visit: Payer: Self-pay

## 2019-10-15 ENCOUNTER — Emergency Department (HOSPITAL_COMMUNITY)
Admission: EM | Admit: 2019-10-15 | Discharge: 2019-10-16 | Disposition: A | Discharge status: Home / Self Care | Payer: HRSA Program | Attending: Emergency Medicine | Admitting: Emergency Medicine

## 2019-10-15 ENCOUNTER — Encounter (HOSPITAL_COMMUNITY): Payer: Self-pay

## 2019-10-15 DIAGNOSIS — Z5321 Procedure and treatment not carried out due to patient leaving prior to being seen by health care provider: Secondary | ICD-10-CM | POA: Diagnosis not present

## 2019-10-15 DIAGNOSIS — R05 Cough: Secondary | ICD-10-CM | POA: Diagnosis not present

## 2019-10-15 DIAGNOSIS — U071 COVID-19: Secondary | ICD-10-CM | POA: Diagnosis not present

## 2019-10-15 DIAGNOSIS — R509 Fever, unspecified: Secondary | ICD-10-CM | POA: Diagnosis present

## 2019-10-15 NOTE — ED Triage Notes (Signed)
Pt sts intermittent fevers and non productive cough.

## 2019-10-16 LAB — SARS CORONAVIRUS 2 BY RT PCR (HOSPITAL ORDER, PERFORMED IN ~~LOC~~ HOSPITAL LAB): SARS Coronavirus 2: POSITIVE — AB

## 2019-10-16 NOTE — ED Notes (Signed)
Date and time results received: 10/16/19 0208 (use smartphrase ".now" to insert current time)  Test: covid Critical Value: positive  Name of Provider Notified: Juleen Starr, MD  Orders Received? Or Actions Taken?: no new orders

## 2019-10-16 NOTE — ED Notes (Signed)
Pt called x2. No answer.  

## 2019-12-11 ENCOUNTER — Encounter (HOSPITAL_COMMUNITY): Payer: Self-pay

## 2019-12-11 ENCOUNTER — Emergency Department (HOSPITAL_COMMUNITY)
Admission: EM | Admit: 2019-12-11 | Discharge: 2019-12-11 | Disposition: A | Discharge status: Home / Self Care | Payer: Self-pay | Attending: Emergency Medicine | Admitting: Emergency Medicine

## 2019-12-11 DIAGNOSIS — M546 Pain in thoracic spine: Secondary | ICD-10-CM | POA: Insufficient documentation

## 2019-12-11 DIAGNOSIS — Z5321 Procedure and treatment not carried out due to patient leaving prior to being seen by health care provider: Secondary | ICD-10-CM | POA: Insufficient documentation

## 2019-12-11 NOTE — ED Triage Notes (Addendum)
Pt presents with c/o mid back pain. Pt reports he was lifting heavy objects at work yesterday when the pain started. Pt reports the pain is present when he turns to the side.

## 2019-12-12 ENCOUNTER — Ambulatory Visit (HOSPITAL_COMMUNITY)
Admission: EM | Admit: 2019-12-12 | Discharge: 2019-12-12 | Disposition: A | Discharge status: Home / Self Care | Payer: Self-pay | Attending: Internal Medicine | Admitting: Internal Medicine

## 2019-12-12 ENCOUNTER — Other Ambulatory Visit: Payer: Self-pay

## 2019-12-12 ENCOUNTER — Encounter (HOSPITAL_COMMUNITY): Payer: Self-pay

## 2019-12-12 DIAGNOSIS — M546 Pain in thoracic spine: Secondary | ICD-10-CM

## 2019-12-12 MED ORDER — METHOCARBAMOL 500 MG PO TABS: 500.0000 mg | ORAL_TABLET | Freq: Two times a day (BID) | ORAL | 0 refills | Status: DC

## 2019-12-12 MED ORDER — IBUPROFEN 600 MG PO TABS: 600.0000 mg | ORAL_TABLET | Freq: Four times a day (QID) | ORAL | 0 refills | Status: DC | PRN

## 2019-12-12 NOTE — ED Triage Notes (Signed)
Pt c/o right thoracic pain for approx 1 week. 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 Reports pain improved for a few days and returned two days ago. Pain non-reproducible on palpation, only occurs when pt "moves a certain way".   Denies SOB, CP.  Took tylenol yesterday. Bilateral breath sounds CTA with good exchange.   H/o "lung collapse".

## 2019-12-12 NOTE — Discharge Instructions (Signed)
Heating pad- 10 mins on/15 mins off 4x. Repeat twice daily. If symptoms worsen ,please return to urgent care to be re-evaluated

## 2019-12-13 NOTE — ED Provider Notes (Signed)
[facility]    CSN: [id number] Arrival date & time: 12/12/19  1737      History   Chief Complaint Chief Complaint  Patient presents with  . Back Pain    HPI Daniel Higgins is a 24 y.o. male comes to the urgent care with complaint of right-sided thoracic back pain.  Patient carried some heavy boxes over the past week and a half.  Pain is throbbing, of moderate severity, aggravated by assuming certain positions.  No known relieving factors.  Is not associated with any shortness of breath, cough, sputum production, fever or chills.  He is not short of breath.   HPI  Past Medical History:  Diagnosis Date  . Lung collapse   . STI (sexually transmitted infection) 05/16/2018    Patient Active Problem List   Diagnosis Date Noted  . 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 Primary spontaneous pneumothorax 10/24/2016    Past Surgical History:  Procedure Laterality Date  . CHEST TUBE INSERTION         Home Medications    Prior to Admission medications   Medication Sig Start Date End Date Taking? Authorizing Provider  ibuprofen (ADVIL) 600 MG tablet Take 1 tablet (600 mg total) by mouth every 6 (six) hours as needed. 12/12/19   Zaylan Kissoon, Britta Mccreedy, MD  methocarbamol (ROBAXIN) 500 MG tablet Take 1 tablet (500 mg total) by mouth 2 (two) times daily. 12/12/19   Merrilee Jansky, MD  fluticasone (FLONASE) 50 MCG/ACT nasal spray Place 2 sprays into both nostrils daily. Patient not taking: Reported on 02/10/2019 06/14/18 12/12/19  Loren Racer, MD  loratadine (CLARITIN) 10 MG tablet Take 1 tablet (10 mg total) by mouth daily. Patient not taking: Reported on 02/10/2019 06/14/18 12/12/19  Loren Racer, MD    Family History Family History  Problem Relation Age of Onset  . Healthy Father     Social History Social History   Tobacco Use  . Smoking status: Former Smoker    Types: Cigars  . Smokeless tobacco: Never Used  Vaping Use  . Vaping Use:  Never used  Substance Use Topics  . Alcohol use: No  . Drug use: No     Allergies   Other   Review of Systems Review of Systems  Constitutional: Negative.   Respiratory: Negative for chest tightness, shortness of breath and wheezing.   Cardiovascular: Negative for chest pain.  Gastrointestinal: Negative.   Genitourinary: Negative.   Musculoskeletal: Positive for back pain. Negative for arthralgias and neck pain.  Skin: Negative.   Neurological: Negative.   Psychiatric/Behavioral: Negative.      Physical Exam Triage Vital Signs ED Triage Vitals  Enc Vitals Group     BP 12/12/19 1841 111/72     Pulse Rate 12/12/19 1841 87     Resp 12/12/19 1841 16     Temp 12/12/19 1841 98.2 F (36.8 C)     Temp Source 12/12/19 1841 Oral     SpO2 12/12/19 1841 98 %     Weight --      Height --      Head Circumference --      Peak Flow --      Pain Score 12/12/19 1837 6     Pain Loc --      Pain Edu? --      Excl. in GC? --    No data found.  Updated Vital Signs BP 111/72 (BP Location: Left Arm)   Pulse 87  Temp 98.2 F (36.8 C) (Oral)   Resp 16   SpO2 98%   Visual Acuity Right Eye Distance:   Left Eye Distance:   Bilateral Distance:    Right Eye Near:   Left Eye Near:    Bilateral Near:     Physical Exam Vitals and nursing note reviewed.  Constitutional:      General: He is not in acute distress.    Appearance: He is not ill-appearing.  Cardiovascular:     Rate and Rhythm: Normal rate and regular rhythm.     Pulses: Normal pulses.     Heart sounds: Normal heart sounds.  Pulmonary:     Effort: Pulmonary effort is normal. No respiratory distress.     Breath sounds: Normal breath sounds. No wheezing or rhonchi.  Abdominal:     General: Bowel sounds are normal.     Palpations: Abdomen is soft.  Musculoskeletal:     Comments: Palpation over the right paraspinal muscle reproduces the pain that the patient is complaining about.  Full range of motion.    Neurological:     Mental Status: He is alert.      UC Treatments / Results  Labs (all labs ordered are listed, but only abnormal results are displayed) Labs Reviewed - No data to display  EKG   Radiology No results found.  Procedures Procedures (including critical care time)  Medications Ordered in UC Medications - No data to display  Initial Impression / Assessment and Plan / UC Course  I have reviewed the triage vital signs and the nursing notes.  Pertinent labs & imaging results that were available during my care of the patient were reviewed by me and considered in my medical decision making (see chart for details).     1.  Right-sided thoracic back pain: This is likely secondary to heavy exertion at work Patient is not short of breath and lung sounds clear so likelihood of pneumothorax is low. Ibuprofen as needed for pain Robaxin as needed for muscle spasm If symptoms persist patient is advised to return to urgent care to be reevaluated. Final Clinical Impressions(s) / UC Diagnoses   Final diagnoses:  Acute right-sided thoracic back pain     Discharge Instructions     Heating pad- 10 mins on/15 mins off 4x. Repeat twice daily. If symptoms worsen ,please return to urgent care to be re-evaluated   ED Prescriptions    Medication Sig Dispense Auth. Provider   methocarbamol (ROBAXIN) 500 MG tablet Take 1 tablet (500 mg total) by mouth 2 (two) times daily. 20 tablet Gonsalo Cuthbertson, Britta Mccreedy, MD   ibuprofen (ADVIL) 600 MG tablet Take 1 tablet (600 mg total) by mouth every 6 (six) hours as needed. 30 tablet Naysa Puskas, Britta Mccreedy, MD     PDMP not reviewed this encounter.   Merrilee Jansky, MD 12/13/19 1401

## 2020-02-22 ENCOUNTER — Other Ambulatory Visit: Payer: Self-pay

## 2020-02-22 ENCOUNTER — Emergency Department (HOSPITAL_COMMUNITY)
Admission: EM | Admit: 2020-02-22 | Discharge: 2020-02-22 | Disposition: A | Discharge status: Home / Self Care | Payer: Self-pay

## 2020-02-22 ENCOUNTER — Emergency Department (HOSPITAL_COMMUNITY): Payer: Self-pay

## 2020-02-22 ENCOUNTER — Emergency Department (HOSPITAL_COMMUNITY)
Admission: EM | Admit: 2020-02-22 | Discharge: 2020-02-22 | Disposition: A | Discharge status: Home / Self Care | Payer: Self-pay | Attending: Emergency Medicine | Admitting: Emergency Medicine

## 2020-02-22 DIAGNOSIS — R059 Cough, unspecified: Secondary | ICD-10-CM | POA: Insufficient documentation

## 2020-02-22 DIAGNOSIS — Z20822 Contact with and (suspected) exposure to covid-19: Secondary | ICD-10-CM | POA: Insufficient documentation

## 2020-02-22 DIAGNOSIS — Z87891 Personal history of nicotine dependence: Secondary | ICD-10-CM | POA: Insufficient documentation

## 2020-02-22 LAB — RESP PANEL BY RT-PCR (FLU A&B, COVID) ARPGX2
Influenza A by PCR: NEGATIVE
Influenza B by PCR: NEGATIVE
SARS Coronavirus 2 by RT PCR: NEGATIVE

## 2020-02-22 MED ORDER — BENZONATATE 100 MG PO CAPS: 100.0000 mg | ORAL_CAPSULE | Freq: Three times a day (TID) | ORAL | 0 refills | Status: DC

## 2020-02-22 NOTE — ED Provider Notes (Signed)
[facility]   CSN: [id number] Arrival date & time: 02/22/20  1751    History Chief Complaint  Patient presents with   Cough    Daniel Higgins is a 24 y.o. male with history significant for spontaneous pneumothorax who presents for evaluation of cough.  He is not vaccinated against Covid.  His girlfriend tested +3 days ago.  He denies fever, chills, nausea, vomiting, chest pain, shortness of breath, hemoptysis, abdominal pain, diarrhea, dysuria, unilateral leg swelling, redness or warmth.  Denies additional aggravating or relieving factors.  He has not take anything for symptoms.  History pain from patient and past medical records.  No interpreter used.  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 Authorizing Provider  ibuprofen (ADVIL) 600 MG tablet Take 1 tablet (600 mg total) by mouth every 6 (six) hours as needed. 12/12/19   Lamptey, Britta Mccreedy, MD  methocarbamol (ROBAXIN) 500 MG tablet Take 1 tablet (500 mg total) by mouth 2 (two) times daily. 12/12/19   Merrilee Jansky, MD  fluticasone (FLONASE) 50 MCG/ACT nasal spray Place 2 sprays into both nostrils daily. Patient not taking: Reported on 02/10/2019 06/14/18 12/12/19  Loren Racer, MD  loratadine (CLARITIN) 10 MG tablet Take 1 tablet (10 mg total) by mouth daily. Patient not taking: Reported on 02/10/2019 06/14/18 12/12/19  Loren Racer, MD    Allergies    Other  Review of Systems   Review of Systems  Constitutional: Negative.   HENT: Negative.   Respiratory: Positive for cough. Negative for apnea, choking, chest tightness, shortness of breath, wheezing and stridor.   Cardiovascular: Negative.   Gastrointestinal: Negative.   Genitourinary: Negative.   Musculoskeletal: Negative.   Skin: Negative.   Neurological: Negative.   All other systems reviewed and are negative.   Physical Exam Updated Vital Signs BP 109/72 (BP Location: Right Arm)    Pulse 84    Temp 98.8 F (37.1 C) (Oral)    Resp 16    Ht 6' (1.829 m)    Wt 57.2 kg    SpO2 97%    BMI 17.09 kg/m   Physical Exam Vitals and nursing note reviewed.  Constitutional:      General: He is not in acute distress.    Appearance: He is not ill-appearing, toxic-appearing or diaphoretic.  HENT:     Head: Normocephalic and atraumatic.     Jaw: There is normal jaw occlusion.     Right Ear: Tympanic membrane, ear canal and external ear normal. There is no impacted cerumen. No hemotympanum. Tympanic membrane is not injected, scarred, perforated, erythematous, retracted or bulging.  Left Ear: Tympanic membrane, ear canal and external ear normal. There is no impacted cerumen. No hemotympanum. Tympanic membrane is not injected, scarred, perforated, erythematous, retracted or bulging.     Ears:     Comments: No Mastoid tenderness.    Nose:     Comments: Clear rhinorrhea and congestion to bilateral nares.  No sinus tenderness.    Mouth/Throat:     Comments: Posterior oropharynx clear.  Mucous membranes moist.  Tonsils without erythema or exudate.  Uvula midline without deviation.  No evidence of PTA or RPA.  No drooling, dysphasia or trismus.  Phonation normal. Neck:     Trachea: Trachea and phonation  normal.     Comments: No Neck stiffness or neck rigidity.  No meningismus.  No cervical lymphadenopathy. Cardiovascular:     Comments: No murmurs rubs or gallops. Pulmonary:     Comments: Clear to auscultation bilaterally without wheeze, rhonchi or rales.  No accessory muscle usage.  Able speak in full sentences. Abdominal:     Comments: Soft, nontender without rebound or guarding.  No CVA tenderness.  Musculoskeletal:     Comments: Moves all 4 extremities without difficulty.  Lower extremities without edema, erythema or warmth.  Skin:    Comments: Brisk capillary refill.  No rashes or lesions.  Neurological:     Mental Status: He is alert.     Comments: Ambulatory in department without difficulty.  Cranial nerves II through XII grossly intact.  No facial droop.  No aphasia.     ED Results / Procedures / Treatments   Labs (all labs ordered are listed, but only abnormal results are displayed) Labs Reviewed  RESP PANEL BY RT-PCR (FLU A&B, COVID) [id number]    EKG None  Radiology DG Chest Portable 1 View  Result Date: 02/22/2020 CLINICAL DATA:  Cough EXAM: PORTABLE CHEST 1 VIEW COMPARISON:  09/03/2019 FINDINGS: The heart size and mediastinal contours are within normal limits. Both lungs are clear. The visualized skeletal structures are unremarkable. IMPRESSION: No acute abnormality of the lungs in AP portable projection. Electronically Signed   By: Lauralyn Primes M.D.   On: 02/22/2020 18:50    Procedures Procedures (including critical care time)  Medications Ordered in ED Medications - No data to display  ED Course  I have reviewed the triage vital signs and the nursing notes.  Pertinent labs & imaging results that were available during my care of the patient were reviewed by me and considered in my medical decision making (see chart for details).  24 year old presents for evaluation of cough.  On arrival he is afebrile, nonseptic, non-ill-appearing.  No associated chest pain,  shortness of breath or hemoptysis.  No clinical evidence of DVT on exam.  His heart and lungs are clear.  His abdomen is soft, nontender.  Appears overall well.  He has not vaccinations COVID.  His significant other tested +3 days ago.  Have history of spontaneous pneumothorax however has equal lung sounds in all fields.  No acute respiratory distress.  Plain film chest does not show any evidence of pneumothorax, cardiomegaly, pulmonary edema, pneumothorax, infiltrates.  Will obtain Covid test here in ED.  Patient has my chart and will follow up with results online.  Discussed symptomatic management, home isolation if he is positive.  Will return for new worsening symptoms  The patient has been appropriately medically screened and/or stabilized in the ED. I have low suspicion for any other emergent medical condition which would require further screening, evaluation or treatment in  the ED or require inpatient management.  Patient is hemodynamically stable and in no acute distress.  Patient able to ambulate in department prior to ED.  Evaluation does not show acute pathology that would require ongoing or additional emergent interventions while in the emergency department or further inpatient treatment.  I have discussed the diagnosis with the patient and answered all questions.  Pain is been managed while in the emergency department and patient has no further complaints prior to discharge.  Patient is comfortable with plan discussed in room and is stable for discharge at this time.  I have discussed strict return precautions for returning to the emergency department.  Patient was encouraged to follow-up with PCP/specialist refer to at discharge.    MDM Rules/Calculators/A&P                          Daniel Higgins was evaluated in Emergency Department on 02/22/2020 for the symptoms described in the history of present illness. He was evaluated in the context of the global COVID-19 pandemic, which  necessitated consideration that the patient might be at risk for infection with the SARS-CoV-2 virus that causes COVID-19. Institutional protocols and algorithms that pertain to the evaluation of patients at risk for COVID-19 are in a state of rapid change based on information released by regulatory bodies including the CDC and federal and state organizations. These policies and algorithms were followed during the patient's care in the ED. Final Clinical Impression(s) / ED Diagnoses Final diagnoses:  None    Rx / DC Orders ED Discharge Orders    None       Nemiah Kissner A, PA-C 02/22/20 1901    Charlynne Pander, MD 02/22/20 2215

## 2020-02-22 NOTE — Discharge Instructions (Addendum)
Use the Tessalon Perles for your cough  Covid test is pending at discharge.  I have written a work note in case this is positive.  You may follow-up with this result on MyChart.  Return for new worsening symptoms such as chest pain, shortness of breath, unilateral leg swelling, redness, warmth, coughing up blood.

## 2020-02-22 NOTE — ED Triage Notes (Signed)
Patient presents today with cough x3 days. Patient requesting COVID test. Patient denies other symptoms.

## 2020-04-28 ENCOUNTER — Other Ambulatory Visit: Payer: Self-pay

## 2020-04-28 ENCOUNTER — Encounter (HOSPITAL_COMMUNITY): Payer: Self-pay

## 2020-04-28 ENCOUNTER — Emergency Department (HOSPITAL_COMMUNITY)
Admission: EM | Admit: 2020-04-28 | Discharge: 2020-04-28 | Disposition: A | Discharge status: Home / Self Care | Payer: Self-pay | Attending: Emergency Medicine | Admitting: Emergency Medicine

## 2020-04-28 DIAGNOSIS — Z87891 Personal history of nicotine dependence: Secondary | ICD-10-CM | POA: Insufficient documentation

## 2020-04-28 DIAGNOSIS — R300 Dysuria: Secondary | ICD-10-CM | POA: Insufficient documentation

## 2020-04-28 DIAGNOSIS — R369 Urethral discharge, unspecified: Secondary | ICD-10-CM | POA: Insufficient documentation

## 2020-04-28 LAB — URINALYSIS, ROUTINE W REFLEX MICROSCOPIC
Bilirubin Urine: NEGATIVE
Glucose, UA: NEGATIVE mg/dL
Hgb urine dipstick: NEGATIVE
Ketones, ur: NEGATIVE mg/dL
Nitrite: NEGATIVE
Protein, ur: NEGATIVE mg/dL
Specific Gravity, Urine: 1.02 (ref 1.005–1.030)
WBC, UA: >50 WBC/hpf — ABNORMAL HIGH (ref 0–5)
pH: 7 (ref 5.0–8.0)

## 2020-04-28 MED ORDER — STERILE WATER FOR INJECTION IJ SOLN
INTRAMUSCULAR | Status: AC
  Filled 2020-04-28: qty 10

## 2020-04-28 MED ORDER — CEFTRIAXONE SODIUM 1 G IJ SOLR
500.0000 mg | Freq: Once | INTRAMUSCULAR | Status: AC
  Administered 2020-04-28: 500 mg via INTRAMUSCULAR
  Filled 2020-04-28: qty 10

## 2020-04-28 MED ORDER — DOXYCYCLINE HYCLATE 100 MG PO TABS
100.0000 mg | ORAL_TABLET | Freq: Once | ORAL | Status: AC
  Administered 2020-04-28: 100 mg via ORAL
  Filled 2020-04-28: qty 1

## 2020-04-28 MED ORDER — DOXYCYCLINE HYCLATE 100 MG PO CAPS: 100.0000 mg | ORAL_CAPSULE | Freq: Two times a day (BID) | ORAL | 0 refills | Status: AC

## 2020-04-28 NOTE — ED Triage Notes (Signed)
Pt c/o dysuria x 2 days and penile discharge, states he's been having unprotected sex. Pt denies hematuria.

## 2020-04-28 NOTE — Discharge Instructions (Addendum)
As discussed, your gonorrhea and Chlamydia cultures are pending.  You were treated for both gonorrhea and chlamydia.  Continue to take your antibiotic as prescribed and finish all antibiotics.  Continue to use protection during sexual intercourse.  Please follow-up with PCP if symptoms not improved within the next week.  Return to the ER for new or worsening symptoms.

## 2020-04-28 NOTE — ED Provider Notes (Signed)
[facility] Provider Note   CSN: [id number] Arrival date & time: 04/28/20  1622     History Chief Complaint  Patient presents with  . Dysuria    Daniel Higgins is a 25 y.o. male with past medical history significant for pneumothorax and history of STI who presents to the ED due to dysuria and penile discharge x2 days.  Patient admits to unprotected intercourse with one sexual partner.  Denies associated abdominal pain, fever, chills, pain with defecation, testicular pain, and scrotal swelling.  Denies hematuria.  No treatment prior to arrival.  No aggravating or alleviating symptoms.  History obtained from patient and past medical records. No interpreter used during encounter.      Past Medical History:  Diagnosis Date  . Lung collapse   . STI (sexually transmitted infection) 05/16/2018    Patient Active Problem List   Diagnosis Date Noted  . 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 Primary spontaneous pneumothorax 10/24/2016    Past Surgical History:  Procedure Laterality Date  . CHEST TUBE INSERTION         Family History  Problem Relation Age of Onset  . Healthy Father     Social History   Tobacco Use  . Smoking status: Former Smoker    Types: Cigars  . Smokeless tobacco: Never Used  Vaping Use  . Vaping Use: Never used  Substance Use Topics  . Alcohol use: No  . Drug use: No    Home Medications Prior to Admission medications   Medication Sig Start Date End Date Taking? Authorizing Provider  doxycycline (VIBRAMYCIN) 100 MG capsule Take 1 capsule (100 mg total) by mouth 2 (two) times daily for 7 days. 04/28/20 05/05/20 Yes Aberman, Merla Riches, PA-C  benzonatate (TESSALON) 100 MG capsule Take 1 capsule (100 mg total) by mouth every 8 (eight) hours. 02/22/20   Henderly, Britni A, PA-C  ibuprofen (ADVIL) 600 MG tablet Take 1 tablet (600 mg total) by mouth every 6 (six) hours as needed. 12/12/19   Lamptey, Britta Mccreedy, MD  methocarbamol (ROBAXIN) 500 MG tablet Take 1 tablet (500 mg total) by mouth 2 (two) times daily. 12/12/19   Merrilee Jansky, MD  fluticasone (FLONASE) 50 MCG/ACT nasal spray Place 2 sprays into both nostrils daily. Patient not taking: Reported on 02/10/2019 06/14/18 12/12/19  Loren Racer, MD  loratadine (CLARITIN) 10 MG tablet Take 1 tablet (10 mg total) by mouth daily. Patient not taking: Reported on 02/10/2019 06/14/18 12/12/19  Loren Racer, MD    Allergies    Other  Review of Systems   Review of Systems  Constitutional: Negative for chills and fever.  Gastrointestinal: Negative for abdominal pain, nausea and vomiting.  Genitourinary: Positive for dysuria and penile discharge. Negative for penile pain, penile swelling, scrotal swelling and testicular pain.    Physical Exam Updated Vital Signs BP 106/69 (BP Location: Left Arm)   Pulse 82   Temp 98.5 F (36.9 C) (Oral)   Resp 18   Ht 5\' 11"  (1.803 m)   Wt 56.7 kg   SpO2 100%   BMI 17.43 kg/m   Physical Exam Vitals and nursing note reviewed.  Constitutional:      General: He is not in acute distress.    Appearance: He is not ill-appearing.  HENT:     Head: Normocephalic.  Eyes:     Pupils: Pupils are equal, round, and reactive to light.  Cardiovascular:     Rate and Rhythm: Normal  rate and regular rhythm.     Pulses: Normal pulses.     Heart sounds: Normal heart sounds. No murmur heard. No friction rub. No gallop.   Pulmonary:     Effort: Pulmonary effort is normal.     Breath sounds: Normal breath sounds.  Abdominal:     General: Abdomen is flat. There is no distension.     Palpations: Abdomen is soft.     Tenderness: There is no abdominal tenderness. There is no guarding or rebound.  Genitourinary:    Comments: GU exam performed with chaperone in room.  Normal circumcised penis.  No penile discharge.  No testicular tenderness, masses, or edema.  No lesions. Musculoskeletal:        General:  Normal range of motion.     Cervical back: Neck supple.  Skin:    General: Skin is warm and dry.  Neurological:     General: No focal deficit present.     Mental Status: He is alert.  Psychiatric:        Mood and Affect: Mood normal.        Behavior: Behavior normal.     ED Results / Procedures / Treatments   Labs (all labs ordered are listed, but only abnormal results are displayed) Labs Reviewed  URINALYSIS, ROUTINE W REFLEX MICROSCOPIC  GC/CHLAMYDIA PROBE AMP ([place]) NOT AT [facility]    EKG None  Radiology No results found.  Procedures Procedures   Medications Ordered in ED Medications  cefTRIAXone (ROCEPHIN) injection 500 mg (has no administration in time range)  doxycycline (VIBRA-TABS) tablet 100 mg (has no administration in time range)    ED Course  I have reviewed the triage vital signs and the nursing notes.  Pertinent labs & imaging results that were available during my care of the patient were reviewed by me and considered in my medical decision making (see chart for details).    MDM Rules/Calculators/A&P                         25 year old male presents to the ED due to penile discharge and dysuria x2 days.  He is currently sexually active with 1 partner without protection.  Vitals all within normal limits.  Patient in no acute distress and non-ill-appearing. Patient is afebrile without abdominal tenderness, abdominal pain or painful bowel movements to indicate prostatitis.  No tenderness to palpation of the testes or epididymis to suggest orchitis or epididymitis.  STD cultures obtained including gonorrhea and chlamydia.  Patient deferred HIV and syphilis testing. UA significant for large leukocytes and rare bacteria. Suspect contamination. Low suspicion for acute cystitis.  Patient discharged with instructions to follow up with PCP. Discussed importance of using protection when sexually active. Pt understands that they have GC/Chlamydia cultures pending and  that they will need to inform all sexual partners if results return positive. Patient has been treated prophylactically with Rocephin and doxycycline. Strict ED precautions discussed with patient. Patient states understanding and agrees to plan. Patient discharged home in no acute distress and stable vitals.  Final Clinical Impression(s) / ED Diagnoses Final diagnoses:  Penile discharge    Rx / DC Orders ED Discharge Orders         Ordered    doxycycline (VIBRAMYCIN) 100 MG capsule  2 times daily        04/28/20 1724           Jesusita Oka 04/28/20 2104    Dykstra,  Quitman Livings, MD 05/01/20 [phone]

## 2020-04-28 NOTE — ED Notes (Signed)
Pt has discharge orders. Pt left without AVS and discharge instructions. Pt left before discharge VS could be taken.

## 2020-07-17 ENCOUNTER — Emergency Department (HOSPITAL_COMMUNITY)
Admission: EM | Admit: 2020-07-17 | Discharge: 2020-07-17 | Discharge status: Against Medical Advice | Payer: Self-pay | Attending: Emergency Medicine | Admitting: Emergency Medicine

## 2020-07-17 ENCOUNTER — Other Ambulatory Visit: Payer: Self-pay

## 2020-07-17 ENCOUNTER — Emergency Department (HOSPITAL_COMMUNITY): Payer: Self-pay

## 2020-07-17 ENCOUNTER — Encounter (HOSPITAL_COMMUNITY): Payer: Self-pay

## 2020-07-17 DIAGNOSIS — M79644 Pain in right finger(s): Secondary | ICD-10-CM | POA: Insufficient documentation

## 2020-07-17 DIAGNOSIS — Z87891 Personal history of nicotine dependence: Secondary | ICD-10-CM | POA: Insufficient documentation

## 2020-07-17 NOTE — ED Provider Notes (Signed)
[facility]-EMERGENCY DEPT Provider Note   CSN: [id number] Arrival date & time: 07/17/20  1804     History Chief Complaint  Patient presents with  . Finger Injury    Daniel Higgins is a 25 y.o. male with no significant past medical history who presents for evaluation of right index finger pain.  Pain began 4 days ago.  Worse with movement.  Begins at his second metacarpal and extends down his finger.  He has not noted any redness, swelling or warmth to his finger.  Pain to entire finger. Has denies any recent traumatic injuries.  No cuts or abrasions.  He does do physical movement for work.  He has not taken anything for his pain.  Rates his pain an 8/10.  Described as sharp when he moves his finger.  Pain does not extend into his proximal hand or wrist.  Denies additional aggravating or alleviating factors.  History obtained from patient and past medical records.  No interpreter used.  HPI     Past Medical History:  Diagnosis Date  . Lung collapse   . STI (sexually transmitted infection) 05/16/2018    Patient Active Problem List   Diagnosis Date Noted  . 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 Primary spontaneous pneumothorax 10/24/2016    Past Surgical History:  Procedure Laterality Date  . CHEST TUBE INSERTION         Family History  Problem Relation Age of Onset  . Healthy Father     Social History   Tobacco Use  . Smoking status: Former Smoker    Types: Cigars  . Smokeless tobacco: Never Used  Vaping Use  . Vaping Use: Never used  Substance Use Topics  . Alcohol use: No  . Drug use: No    Home Medications Prior to Admission medications   Medication Sig Start Date End Date Taking? Authorizing Provider  benzonatate (TESSALON) 100 MG capsule Take 1 capsule (100 mg total) by mouth every 8 (eight) hours. 02/22/20   Aarit Kashuba A, PA-C  ibuprofen (ADVIL) 600 MG tablet Take 1 tablet (600 mg total) by mouth every 6 (six)  hours as needed. 12/12/19   Lamptey, Britta Mccreedy, MD  methocarbamol (ROBAXIN) 500 MG tablet Take 1 tablet (500 mg total) by mouth 2 (two) times daily. 12/12/19   Merrilee Jansky, MD  fluticasone (FLONASE) 50 MCG/ACT nasal spray Place 2 sprays into both nostrils daily. Patient not taking: Reported on 02/10/2019 06/14/18 12/12/19  Loren Racer, MD  loratadine (CLARITIN) 10 MG tablet Take 1 tablet (10 mg total) by mouth daily. Patient not taking: Reported on 02/10/2019 06/14/18 12/12/19  Loren Racer, MD    Allergies    Other  Review of Systems   Review of Systems  Constitutional: Negative.   HENT: Negative.   Respiratory: Negative.   Cardiovascular: Negative.   Gastrointestinal: Negative.   Genitourinary: Negative.   Musculoskeletal: Negative for back pain, gait problem, joint swelling, myalgias, neck pain and neck stiffness.       Right index finger pain  Skin: Negative.   Neurological: Negative.   All other systems reviewed and are negative.   Physical Exam Updated Vital Signs Ht 5\' 11"  (1.803 m)   Wt 56 kg   BMI 17.22 kg/m   Physical Exam Vitals and nursing note reviewed.  Constitutional:      General: He is not in acute distress.    Appearance: He is well-developed. He is not ill-appearing, toxic-appearing or diaphoretic.  HENT:     Head: Normocephalic and atraumatic.     Nose: Nose normal.     Mouth/Throat:     Mouth: Mucous membranes are moist.  Eyes:     Pupils: Pupils are equal, round, and reactive to light.  Cardiovascular:     Rate and Rhythm: Normal rate and regular rhythm.     Pulses: Normal pulses.     Heart sounds: Normal heart sounds.  Pulmonary:     Effort: Pulmonary effort is normal. No respiratory distress.     Breath sounds: Normal breath sounds.  Abdominal:     General: Bowel sounds are normal. There is no distension.     Palpations: Abdomen is soft.  Musculoskeletal:        General: Tenderness present. No swelling, deformity or signs of  injury. Normal range of motion.     Cervical back: Normal range of motion and neck supple.     Right lower leg: No edema.     Left lower leg: No edema.     Comments: Diffuse tenderness at second metacarpal and diffusely through the digit.  Able to flex and extend without difficulty.  No overlying skin changes.  Diffuse tenderness, no local tenderness to flexor tendon.  Skin:    General: Skin is warm and dry.     Capillary Refill: Capillary refill takes less than 2 seconds.     Comments: No edema, erythema or warmth.  No soft tissue swelling.  Neurological:     General: No focal deficit present.     Mental Status: He is alert and oriented to person, place, and time.     Comments: Equal handgrip bilaterally Intact sensation     ED Results / Procedures / Treatments   Labs (all labs ordered are listed, but only abnormal results are displayed) Labs Reviewed - No data to display  EKG None  Radiology DG Finger Index Right  Result Date: 07/17/2020 CLINICAL DATA:  Atraumatic second right finger pain. EXAM: RIGHT INDEX FINGER 2+V COMPARISON:  None. FINDINGS: There is no evidence of fracture or dislocation. There is no evidence of arthropathy or other focal bone abnormality. Soft tissues are unremarkable. IMPRESSION: Negative. Electronically Signed   By: Aram Candela M.D.   On: 07/17/2020 19:33    Procedures Procedures   Medications Ordered in ED Medications - No data to display  ED Course  I have reviewed the triage vital signs and the nursing notes.  Pertinent labs & imaging results that were available during my care of the patient were reviewed by me and considered in my medical decision making (see chart for details).   25 year old here for evaluation of atraumatic right index finger pain over the last 4 days.  He is afebrile, nonseptic, not ill-appearing.  Diffuse tenderness starting at his second metacarpal distally to digit.  He is able to flex and extend.  He has no  fusiform swelling.  No focal tenderness to flexor tendon sheath.  He has no overlying erythema, warmth.  No bony tenderness to remaining hand.  He is neurovascularly intact.  Equal handgrip bilaterally.  Exam not consistent with flexor tenosynovitis.  No lacerations.  X-ray here does not show evidence of fracture, dislocation.  Vital signs not charted in epic however I was in room when nursing obtain these WNL.  Per nursing patient eloped from the emergency department after having x-ray obtained and before reassessment.    MDM Rules/Calculators/A&P  Final Clinical Impression(s) / ED Diagnoses Final diagnoses:  Pain of finger of right hand    Rx / DC Orders ED Discharge Orders    None       Arrow Tomko A, PA-C 07/17/20 1949    Little, Ambrose Finland, MD 07/17/20 2241

## 2020-07-17 NOTE — ED Triage Notes (Signed)
Right pointer finger pain with movement x4 days. Denies injury

## 2020-08-02 ENCOUNTER — Emergency Department (HOSPITAL_COMMUNITY): Admission: EM | Admit: 2020-08-02 | Discharge: 2020-08-02 | Discharge status: Against Medical Advice | Payer: Self-pay

## 2020-08-02 ENCOUNTER — Other Ambulatory Visit: Payer: Self-pay

## 2020-08-14 ENCOUNTER — Emergency Department (HOSPITAL_COMMUNITY)
Admission: EM | Admit: 2020-08-14 | Discharge: 2020-08-14 | Disposition: A | Discharge status: Home / Self Care | Payer: Self-pay | Attending: Emergency Medicine | Admitting: Emergency Medicine

## 2020-08-14 ENCOUNTER — Encounter (HOSPITAL_COMMUNITY): Payer: Self-pay

## 2020-08-14 ENCOUNTER — Emergency Department (HOSPITAL_COMMUNITY): Payer: Self-pay

## 2020-08-14 ENCOUNTER — Other Ambulatory Visit: Payer: Self-pay

## 2020-08-14 DIAGNOSIS — F17290 Nicotine dependence, other tobacco product, uncomplicated: Secondary | ICD-10-CM | POA: Insufficient documentation

## 2020-08-14 DIAGNOSIS — T148XXA Other injury of unspecified body region, initial encounter: Secondary | ICD-10-CM

## 2020-08-14 DIAGNOSIS — M25511 Pain in right shoulder: Secondary | ICD-10-CM | POA: Insufficient documentation

## 2020-08-14 NOTE — ED Notes (Signed)
An After Visit Summary was printed and given to the patient. Discharge instructions given and no further questions at this time.  

## 2020-08-14 NOTE — ED Provider Notes (Signed)
[facility]-EMERGENCY DEPT Provider Note   CSN: [id number] Arrival date & time: 08/14/20  1833     History Chief Complaint  Patient presents with   Back Pain    Daniel Higgins is a 25 y.o. male.  25 year old male who presents with right-sided scapular pain x2 days.  Pain is worse with movement.  Denies any history of trauma.  No shortness of breath or chest pain.  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 Authorizing Provider  benzonatate (TESSALON) 100 MG capsule Take 1 capsule (100 mg total) by mouth every 8 (eight) hours. 02/22/20   Henderly, Britni A, PA-C  ibuprofen (ADVIL) 600 MG tablet Take 1 tablet (600 mg total) by mouth every 6 (six) hours as needed. 12/12/19   Lamptey, Britta Mccreedy, MD  methocarbamol (ROBAXIN) 500 MG tablet Take 1 tablet (500 mg total) by mouth 2 (two) times daily. 12/12/19   Merrilee Jansky, MD  fluticasone (FLONASE) 50 MCG/ACT nasal spray Place 2 sprays into both nostrils daily. Patient not taking: Reported on 02/10/2019 06/14/18 12/12/19  Loren Racer, MD  loratadine (CLARITIN) 10 MG tablet Take 1 tablet (10 mg total) by mouth daily. Patient not taking: Reported on 02/10/2019 06/14/18 12/12/19   Loren Racer, MD    Allergies    Other  Review of Systems   Review of Systems  All other systems reviewed and are negative.  Physical Exam Updated Vital Signs BP 104/67 (BP Location: Right Arm)   Pulse 93   Temp 99 F (37.2 C) (Oral)   Resp 18   Ht 1.803 m (5\' 11" )   Wt 59.3 kg   SpO2 99%   BMI 18.24 kg/m   Physical Exam Vitals and nursing note reviewed.  Constitutional:      General: He is not in acute distress.    Appearance: Normal appearance. He is well-developed. He is not toxic-appearing.  HENT:     Head: Normocephalic and atraumatic.  Eyes:     General: Lids are normal.     Conjunctiva/sclera: Conjunctivae normal.     Pupils: Pupils are equal, round, and reactive to light.  Neck:     Thyroid: No thyroid mass.     Trachea: No tracheal deviation.  Cardiovascular:     Rate and Rhythm: Normal rate and regular rhythm.     Heart sounds: Normal heart sounds. No murmur heard.   No gallop.  Pulmonary:     Effort: Pulmonary effort is normal. No respiratory distress.     Breath sounds: Normal breath sounds. No stridor. No decreased breath sounds, wheezing, rhonchi  or rales.  Abdominal:     General: There is no distension.     Palpations: Abdomen is soft.     Tenderness: There is no abdominal tenderness. There is no rebound.  Musculoskeletal:        General: No tenderness. Normal range of motion.     Cervical back: Normal range of motion and neck supple.  Skin:    General: Skin is warm and dry.     Findings: No abrasion or rash.  Neurological:     Mental Status: He is alert and oriented to person, place, and time. Mental status is at baseline.     GCS: GCS eye subscore is 4. GCS verbal subscore is 5. GCS motor subscore is 6.     Cranial Nerves: Cranial nerves are intact. No cranial nerve deficit.     Sensory: No sensory deficit.     Motor: Motor function is intact.  Psychiatric:        Attention and Perception: Attention normal.        Speech: Speech  normal.        Behavior: Behavior normal.    ED Results / Procedures / Treatments   Labs (all labs ordered are listed, but only abnormal results are displayed) Labs Reviewed - No data to display  EKG None  Radiology DG Chest 2 View  Result Date: 08/14/2020 CLINICAL DATA:  25 year old male with back pain. EXAM: CHEST - 2 VIEW COMPARISON:  Chest radiograph dated 02/22/2020. FINDINGS: The heart size and mediastinal contours are within normal limits. Both lungs are clear. The visualized skeletal structures are unremarkable. IMPRESSION: No active cardiopulmonary disease. Electronically Signed   By: Elgie Collard M.D.   On: 08/14/2020 19:05    Procedures Procedures   Medications Ordered in ED Medications - No data to display  ED Course  I have reviewed the triage vital signs and the nursing notes.  Pertinent labs & imaging results that were available during my care of the patient were reviewed by me and considered in my medical decision making (see chart for details).    MDM Rules/Calculators/A&P                          Chest x-ray is negative.  Suspect musculoskeletal strain.  Will discharge home Final Clinical Impression(s) / ED Diagnoses Final diagnoses:  None    Rx / DC Orders ED Discharge Orders     None        Lorre Nick, MD 08/14/20 1956

## 2020-08-14 NOTE — Discharge Instructions (Addendum)
Use ibuprofen as directed for pain 

## 2020-08-14 NOTE — ED Triage Notes (Signed)
Patient c/o right upper back pain near his shoulder blade x 1 week. Pain is worse with turning. Patient denies any injury or heavy lifting.

## 2020-10-18 ENCOUNTER — Emergency Department (HOSPITAL_COMMUNITY)
Admission: EM | Admit: 2020-10-18 | Discharge: 2020-10-18 | Disposition: A | Discharge status: Home / Self Care | Payer: Self-pay | Attending: Emergency Medicine | Admitting: Emergency Medicine

## 2020-10-18 ENCOUNTER — Other Ambulatory Visit: Payer: Self-pay

## 2020-10-18 DIAGNOSIS — M25562 Pain in left knee: Secondary | ICD-10-CM | POA: Insufficient documentation

## 2020-10-18 DIAGNOSIS — Z5321 Procedure and treatment not carried out due to patient leaving prior to being seen by health care provider: Secondary | ICD-10-CM | POA: Insufficient documentation

## 2020-10-18 NOTE — ED Triage Notes (Signed)
Pt reports his left knee sometimes locks up and cramps up periodically.  Pt reports he thinks there is a tiny bit of swelling and this issue has been ongoing for a while.  Pt denies pain, redness, or warmth.  Pt denies fevers or injury.  Pt ambulated to triage room with steady gait.

## 2020-11-30 ENCOUNTER — Emergency Department (HOSPITAL_COMMUNITY): Admission: EM | Admit: 2020-11-30 | Discharge: 2020-11-30 | Discharge status: Against Medical Advice | Payer: Self-pay

## 2020-11-30 NOTE — ED Notes (Signed)
No answer for triage x3 

## 2020-12-04 ENCOUNTER — Encounter (HOSPITAL_COMMUNITY): Payer: Self-pay

## 2020-12-04 ENCOUNTER — Other Ambulatory Visit: Payer: Self-pay

## 2020-12-04 ENCOUNTER — Emergency Department (HOSPITAL_COMMUNITY)
Admission: EM | Admit: 2020-12-04 | Discharge: 2020-12-04 | Disposition: A | Discharge status: Home / Self Care | Payer: Self-pay | Attending: Emergency Medicine | Admitting: Emergency Medicine

## 2020-12-04 DIAGNOSIS — Z7689 Persons encountering health services in other specified circumstances: Secondary | ICD-10-CM

## 2020-12-04 DIAGNOSIS — F17290 Nicotine dependence, other tobacco product, uncomplicated: Secondary | ICD-10-CM | POA: Insufficient documentation

## 2020-12-04 DIAGNOSIS — Z113 Encounter for screening for infections with a predominantly sexual mode of transmission: Secondary | ICD-10-CM | POA: Insufficient documentation

## 2020-12-04 LAB — URINALYSIS, ROUTINE W REFLEX MICROSCOPIC
Bilirubin Urine: NEGATIVE
Glucose, UA: NEGATIVE mg/dL
Hgb urine dipstick: NEGATIVE
Ketones, ur: NEGATIVE mg/dL
Leukocytes,Ua: NEGATIVE
Nitrite: NEGATIVE
Protein, ur: 30 mg/dL — AB
Specific Gravity, Urine: 1.023 (ref 1.005–1.030)
pH: 6 (ref 5.0–8.0)

## 2020-12-04 MED ORDER — LIDOCAINE HCL (PF) 1 % IJ SOLN
1.0000 mL | Freq: Once | INTRAMUSCULAR | Status: AC
  Administered 2020-12-04: 1 mL

## 2020-12-04 MED ORDER — STERILE WATER FOR INJECTION IJ SOLN
INTRAMUSCULAR | Status: AC
  Filled 2020-12-04: qty 10

## 2020-12-04 MED ORDER — CEFTRIAXONE SODIUM 1 G IJ SOLR
500.0000 mg | Freq: Once | INTRAMUSCULAR | Status: AC
  Administered 2020-12-04: 500 mg via INTRAMUSCULAR
  Filled 2020-12-04: qty 10

## 2020-12-04 MED ORDER — DOXYCYCLINE HYCLATE 100 MG PO CAPS: 100.0000 mg | ORAL_CAPSULE | Freq: Two times a day (BID) | ORAL | 0 refills | Status: AC

## 2020-12-04 NOTE — ED Notes (Signed)
Pt. Refused lab testing for HIV. PA, Logan made aware.

## 2020-12-04 NOTE — ED Notes (Signed)
Pt refused blood work to be done. Logan, [place] aware. An After Visit Summary was printed and given to the patient. Discharge instructions given and no further questions at this time.

## 2020-12-04 NOTE — Discharge Instructions (Addendum)
I prescribed you an additional antibiotic called doxycycline.  Please take this twice a day for the next 7 days.  Do not stop taking this medication early.  Please refrain from sexual intercourse for the next 2 weeks.  Please check the results of your gonorrhea/chlamydia test on MyChart.  If you develop any new or worsening symptoms please come back to the emergency department.

## 2020-12-04 NOTE — ED Triage Notes (Signed)
Pt states he was exposed to an STD 2 days ago, having unprotected sex. Pt has no physical complaints, pt has no symptoms at this time.

## 2020-12-04 NOTE — ED Provider Notes (Signed)
[facility]-EMERGENCY DEPT Provider Note   CSN: [id number] Arrival date & time: 12/04/20  1722     History Chief Complaint  Patient presents with   STD check    Daniel Higgins is a 25 y.o. male.  HPI Patient is a 25 year old male who presents to the emergency department for STD check.  Patient states he had unprotected sexual intercourse with a new male partner 2 days ago.  He was notified today that she tested positive for an STD but he is unsure of which one.  He is currently asymptomatic and denies any physical complaints at this time.    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 Authorizing Provider  doxycycline (VIBRAMYCIN) 100 MG capsule Take 1 capsule (100 mg total) by mouth 2 (two) times daily for 7 days. 12/04/20 12/11/20 Yes Placido Sou, PA-C  benzonatate (TESSALON) 100 MG capsule Take 1 capsule (100 mg total) by mouth every 8 (eight) hours. 02/22/20   Henderly, Britni A, PA-C  ibuprofen (ADVIL) 600 MG tablet Take 1 tablet (600 mg total) by mouth every 6 (six) hours as needed. 12/12/19   Lamptey, Britta Mccreedy, MD  methocarbamol (ROBAXIN) 500 MG tablet Take 1 tablet (500 mg total) by mouth 2 (two) times daily. 12/12/19   Merrilee Jansky, MD  fluticasone (FLONASE) 50 MCG/ACT nasal  spray Place 2 sprays into both nostrils daily. Patient not taking: Reported on 02/10/2019 06/14/18 12/12/19  Loren Racer, MD  loratadine (CLARITIN) 10 MG tablet Take 1 tablet (10 mg total) by mouth daily. Patient not taking: Reported on 02/10/2019 06/14/18 12/12/19  Loren Racer, MD    Allergies    Other  Review of Systems   Review of Systems  Constitutional:  Negative for fever.  Genitourinary:  Negative for decreased urine volume, difficulty urinating, dysuria, frequency, genital sores, hematuria, penile discharge, penile pain, penile swelling, scrotal swelling and testicular pain.   Physical Exam Updated Vital Signs BP 129/71   Pulse 77   Temp 98 F (36.7 C) (Oral)   Resp 18   SpO2 99%   Physical Exam Vitals and nursing note reviewed.  Constitutional:      General: He is not in acute distress.    Appearance: He is well-developed.  HENT:     Head: Normocephalic and atraumatic.     Right Ear: External ear normal.     Left Ear: External ear normal.  Eyes:     General: No scleral icterus.       Right eye: No discharge.        Left eye: No discharge.     Conjunctiva/sclera: Conjunctivae normal.  Neck:     Trachea: No tracheal deviation.  Cardiovascular:     Rate and Rhythm: Normal rate.  Pulmonary:     Effort: Pulmonary effort is normal. No respiratory distress.     Breath sounds: No stridor.  Abdominal:     General: There is no distension.  Genitourinary:    Comments: Nursing chaperone present.  Normal-appearing circumcised penis.  Normal-appearing scrotal region.  No tenderness appreciated along the penile shaft.  No discharge noted from the urethral meatus.  No tenderness appreciated along the bilateral testicles or epididymis.  No rash or lesions. Musculoskeletal:        General: No swelling or deformity.     Cervical back: Neck supple.  Skin:    General: Skin is warm and dry.     Findings: No rash.  Neurological:     Mental Status: He is alert.      Cranial Nerves: Cranial nerve deficit: no gross deficits.    ED Results / Procedures / Treatments   Labs (all labs ordered are listed, but only abnormal results are displayed) Labs Reviewed  URINALYSIS, ROUTINE W REFLEX MICROSCOPIC  GC/CHLAMYDIA PROBE AMP ([place]) NOT AT [facility]    EKG None  Radiology No results found.  Procedures Procedures   Medications Ordered in ED Medications  cefTRIAXone (ROCEPHIN) injection 500 mg (has no administration in time range)  lidocaine (PF) (XYLOCAINE) 1 % injection 1 mL (has no administration in time range)  sterile water (preservative free) injection (has no administration in time range)    ED Course  I have reviewed the triage vital signs and the nursing notes.  Pertinent labs & imaging results that were available during my care of the patient were reviewed by me and considered in my medical decision making (see chart for details).  Clinical Course as of 12/04/20 2021  Thu Dec 04, 2020  2008 Nursing staff notified me that patient refused HIV screening. [LJ]    Clinical Course User Index [LJ] Placido Sou, PA-C   MDM Rules/Calculators/A&P                          Patient is a 25 year old male who presents to the emergency department for STI testing and treatment.  Patient states that he had unprotected sexual intercourse with a new male partner 2 days ago who called him today and told him that she was positive for an STD but cannot specify which STD.  Patient denies any complaints at this time.  Physical exam is reassuring.  I obtained a UA as well as GC/chlamydia that are pending.  Patient is going to check these results on MyChart.  He request to be treated at this time.  Feel that this is reasonable.  Patient given IM Rocephin and discharged with a course of doxycycline.  Discussed safe sex practices.  Recommended he refrain from sex for the next 1 to 2 weeks until he completes his antibiotics.  Discussed return precautions.   Feel that he is stable for discharge at this time and he is agreeable.  His questions were answered and he was amicable at the time of discharge.  Final Clinical Impression(s) / ED Diagnoses Final diagnoses:  Encounter for assessment of STD exposure   Rx / DC Orders ED Discharge Orders          Ordered    doxycycline (VIBRAMYCIN) 100 MG capsule  2 times daily        12/04/20 2016  Placido Sou, PA-C 12/04/20 2021    Arby Barrette, MD 12/15/20 1523

## 2020-12-05 LAB — GC/CHLAMYDIA PROBE AMP (~~LOC~~) NOT AT ARMC
Chlamydia: POSITIVE — AB
Comment: NEGATIVE
Comment: NORMAL
Neisseria Gonorrhea: NEGATIVE

## 2021-02-01 ENCOUNTER — Emergency Department (HOSPITAL_COMMUNITY)
Admission: EM | Admit: 2021-02-01 | Discharge: 2021-02-01 | Discharge status: Against Medical Advice | Payer: Self-pay | Source: Home / Self Care

## 2021-02-08 ENCOUNTER — Other Ambulatory Visit: Payer: Self-pay

## 2021-02-08 ENCOUNTER — Encounter (HOSPITAL_COMMUNITY): Payer: Self-pay

## 2021-02-08 ENCOUNTER — Emergency Department (HOSPITAL_COMMUNITY)
Admission: EM | Admit: 2021-02-08 | Discharge: 2021-02-08 | Disposition: A | Discharge status: Home / Self Care | Payer: Self-pay | Attending: Emergency Medicine | Admitting: Emergency Medicine

## 2021-02-08 ENCOUNTER — Emergency Department (HOSPITAL_COMMUNITY): Payer: Self-pay

## 2021-02-08 DIAGNOSIS — F17290 Nicotine dependence, other tobacco product, uncomplicated: Secondary | ICD-10-CM | POA: Insufficient documentation

## 2021-02-08 DIAGNOSIS — Y9367 Activity, basketball: Secondary | ICD-10-CM | POA: Insufficient documentation

## 2021-02-08 DIAGNOSIS — M7989 Other specified soft tissue disorders: Secondary | ICD-10-CM | POA: Insufficient documentation

## 2021-02-08 DIAGNOSIS — M25532 Pain in left wrist: Secondary | ICD-10-CM | POA: Insufficient documentation

## 2021-02-08 DIAGNOSIS — W1839XA Other fall on same level, initial encounter: Secondary | ICD-10-CM | POA: Insufficient documentation

## 2021-02-08 MED ORDER — OXYCODONE-ACETAMINOPHEN 5-325 MG PO TABS
1.0000 | ORAL_TABLET | Freq: Once | ORAL | Status: AC
  Administered 2021-02-08: 1 via ORAL
  Filled 2021-02-08: qty 1

## 2021-02-08 MED ORDER — IBUPROFEN 800 MG PO TABS
800.0000 mg | ORAL_TABLET | Freq: Once | ORAL | Status: AC
  Administered 2021-02-08: 800 mg via ORAL
  Filled 2021-02-08: qty 1

## 2021-02-08 NOTE — ED Provider Notes (Signed)
[facility]-EMERGENCY DEPT Provider Note   CSN: [id number] Arrival date & time: 02/08/21  1701     History Chief Complaint  Patient presents with   Wrist Pain    Daniel Higgins is a 25 y.o. male who presents with concern for severe left wrist and elbow pain after FOOSH 4 days ago while playing basketball.  Thought was a wrist sprain therefore did not present for evaluation sooner.  Endorses numbness and tingling in his fingers intermittently as well as shooting pains from his elbow down through his arm.  No history of the same.  I have personally reviewed this patient's medical records.  He does not carry any medical diagnoses nor is he any medications daily.  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 Authorizing Provider  benzonatate (TESSALON) 100 MG capsule Take 1 capsule (100 mg total) by mouth every 8 (eight) hours. 02/22/20   Henderly, Britni A, PA-C  ibuprofen (ADVIL) 600 MG tablet Take 1 tablet (600 mg total) by mouth every 6 (six) hours as needed. 12/12/19   Lamptey, Britta Mccreedy, MD  methocarbamol (ROBAXIN) 500 MG tablet Take 1 tablet (500 mg total) by mouth 2 (two) times daily. 12/12/19   Merrilee Jansky, MD  fluticasone (FLONASE) 50 MCG/ACT nasal spray Place 2 sprays into  both nostrils daily. Patient not taking: Reported on 02/10/2019 06/14/18 12/12/19  Loren Racer, MD  loratadine (CLARITIN) 10 MG tablet Take 1 tablet (10 mg total) by mouth daily. Patient not taking: Reported on 02/10/2019 06/14/18 12/12/19  Loren Racer, MD    Allergies    Other  Review of Systems   Review of Systems  Constitutional: Negative.   HENT: Negative.    Eyes: Negative.   Respiratory: Negative.    Cardiovascular: Negative.   Gastrointestinal: Negative.   Genitourinary: Negative.   Musculoskeletal:  Positive for arthralgias.  Skin: Negative.   Neurological: Negative.   Hematological: Negative.   Psychiatric/Behavioral: Negative.     Physical Exam Updated Vital Signs BP (!) 125/45 (BP Location: Right Arm)   Pulse 100   Temp 98.1 F (36.7 C) (Oral)   Resp 16   SpO2 98%   Physical Exam Vitals and nursing note reviewed.  HENT:     Head: Normocephalic and atraumatic.  Eyes:     General: No scleral icterus.       Right eye: No discharge.        Left eye: No discharge.     Conjunctiva/sclera: Conjunctivae normal.  Pulmonary:     Effort: Pulmonary effort is normal.  Musculoskeletal:     Right shoulder: Normal.     Left shoulder: Normal.  Right upper arm: Normal.     Left upper arm: Normal.     Right elbow: Normal.     Left elbow: No swelling or deformity. Tenderness present.     Right forearm: Normal.     Left forearm: Bony tenderness present. No swelling, edema or deformity.     Right wrist: Normal.     Left wrist: Swelling, tenderness, bony tenderness and snuff box tenderness present. No deformity, effusion, lacerations or crepitus. Decreased range of motion. Normal pulse.     Right hand: Normal.     Left hand: Swelling and tenderness present. No bony tenderness. Normal capillary refill. Normal pulse.       Arms:     Comments: 2+ radial pulse Decreased sensation in all fingers to painful stimuli.  Patient unwilling to attempt to range any of  the digits of the left hand or the left wrist or elbow.  Denies tenderness palpation of the left shoulder  Pain with passive range of motion of the left wrist and elbow.  The patient was able to actively range the elbow and wrist when removing his sweatshirt and appeared painful though he was able to do this.  Also able to completely flex and extend the fingers during this maneuver.  Skin:    General: Skin is warm and dry.  Neurological:     General: No focal deficit present.     Mental Status: He is alert.     Sensory: Sensory deficit present.     Comments: Decreased sensation to painful stimuli in all 5 digits of the left hand  Psychiatric:        Mood and Affect: Mood normal.    ED Results / Procedures / Treatments   Labs (all labs ordered are listed, but only abnormal results are displayed) Labs Reviewed - No data to display  EKG None  Radiology DG Elbow Complete Left  Result Date: 02/08/2021 CLINICAL DATA:  Basketball injury to left wrist and elbow, pain EXAM: LEFT ELBOW - COMPLETE 3+ VIEW COMPARISON:  None. FINDINGS: No fracture or dislocation is seen. The joint spaces are preserved. Visualized soft tissues are within normal limits. No displaced elbow joint fat pads to suggest an elbow joint effusion. IMPRESSION: Negative. Electronically Signed   By: Charline Bills M.D.   On: 02/08/2021 19:12   DG Wrist Complete Left  Result Date: 02/08/2021 CLINICAL DATA:  Basketball injury to left wrist and elbow, pain EXAM: LEFT WRIST - COMPLETE 3+ VIEW COMPARISON:  None. FINDINGS: No fracture or dislocation is seen. The joint spaces are preserved. Visualized soft tissues are within normal limits. IMPRESSION: Negative. Electronically Signed   By: Charline Bills M.D.   On: 02/08/2021 19:12    Procedures Procedures   Medications Ordered in ED Medications  ibuprofen (ADVIL) tablet 800 mg (has no administration in time range)  oxyCODONE-acetaminophen (PERCOCET/ROXICET) 5-325 MG per  tablet 1 tablet (1 tablet Oral Given 02/08/21 1846)    ED Course  I have reviewed the triage vital signs and the nursing notes.  Pertinent labs & imaging results that were available during my care of the patient were reviewed by me and considered in my medical decision making (see chart for details).    MDM Rules/Calculators/A&P                         25 year old male presents with left elbow and wrist pain after FOOSH and hyperextension.  Differential diagnosis includes limited to acute  fracture dislocation, ligamentous or tendinous injury, cellulitis.  Hypertensive on intake, vital signs otherwise normal.  Cardiopulmonary exam is normal.  Musculoskeletal exam revealed mild soft tissue edema over the dorsum of the left hand along the radial aspect with snuffbox tenderness in this area.  Normal symmetric radial pulses bilaterally and normal cap refill in all 5 digits of the left hand.  Pain with passive and active range of motion of the left elbow and wrist.  Plain films negative for acute fractures or dislocations.  No fat pad sign of the elbow either.  Suspect sprain/ligamentous injury of the joints.  Given snuffbox tenderness will place patient in removable wrist splint with thumb spica and will place patient in a sling.  We will have him follow-up with a hand specialist.  No further work-up warranted in the ER at this time.  Patient remains vascularly intact at this time though he does continue to endorse decreased sensation in the hand.  Deontaye voiced understanding of his medical evaluation and treatment plan.  His questions answered to his expressed satisfaction.  Return precautions are given.  Patient is well-appearing, stable, and appropriate for discharge at this time.  This chart was dictated using voice recognition software, Dragon. Despite the best efforts of this provider to proofread and correct errors, errors may still occur which can change documentation meaning.   Final  Clinical Impression(s) / ED Diagnoses Final diagnoses:  Left wrist pain    Rx / DC Orders ED Discharge Orders     None        Sherrilee Gilles 02/08/21 2103    Wynetta Fines, MD 02/08/21 2251

## 2021-02-08 NOTE — Discharge Instructions (Signed)
You are seen in the ER today for your wrist and elbow pain.  Your x-rays were negative for any broken bones.  Suspect you have sprained your joints.  He may alternate Tylenol and ibuprofen every 3 hours as needed without exceeding the parameters on the bottles at home.  Please follow-up with the hand specialist listed below and return to the ER with any new severe symptoms.  Please wear the brace on your wrist at all times with the exception of when you are bathing.

## 2021-02-08 NOTE — ED Triage Notes (Signed)
Pt states he was playing basketball and injured his left wrist 4 days ago.

## 2021-02-08 NOTE — Progress Notes (Signed)
Orthopedic Tech Progress Note Patient Details:  Daniel Higgins Apr 20, 1995 [id number]  Ortho Devices Type of Ortho Device: Arm sling, Velcro wrist splint Ortho Device/Splint Location: Left wrist/arm Ortho Device/Splint Interventions: Application   Post Interventions Patient Tolerated: Well  [name] Daniel Higgins 02/08/2021, 9:05 PM

## 2021-03-07 ENCOUNTER — Encounter (HOSPITAL_COMMUNITY): Payer: Self-pay

## 2021-03-07 ENCOUNTER — Other Ambulatory Visit: Payer: Self-pay

## 2021-03-07 ENCOUNTER — Emergency Department (HOSPITAL_COMMUNITY): Payer: Self-pay

## 2021-03-07 ENCOUNTER — Emergency Department (HOSPITAL_COMMUNITY)
Admission: EM | Admit: 2021-03-07 | Discharge: 2021-03-07 | Disposition: A | Discharge status: Home / Self Care | Payer: Self-pay | Attending: Emergency Medicine | Admitting: Emergency Medicine

## 2021-03-07 ENCOUNTER — Emergency Department (HOSPITAL_COMMUNITY)
Admission: EM | Admit: 2021-03-07 | Discharge: 2021-03-08 | Disposition: A | Discharge status: Other Acute Inpatient Hospital | Payer: Self-pay | Attending: Emergency Medicine | Admitting: Emergency Medicine

## 2021-03-07 ENCOUNTER — Encounter (HOSPITAL_COMMUNITY): Payer: Self-pay | Admitting: Oncology

## 2021-03-07 DIAGNOSIS — R000 Tachycardia, unspecified: Secondary | ICD-10-CM | POA: Insufficient documentation

## 2021-03-07 DIAGNOSIS — R45851 Suicidal ideations: Secondary | ICD-10-CM | POA: Insufficient documentation

## 2021-03-07 DIAGNOSIS — F332 Major depressive disorder, recurrent severe without psychotic features: Secondary | ICD-10-CM | POA: Insufficient documentation

## 2021-03-07 DIAGNOSIS — Z046 Encounter for general psychiatric examination, requested by authority: Secondary | ICD-10-CM

## 2021-03-07 DIAGNOSIS — I82220 Acute embolism and thrombosis of inferior vena cava: Secondary | ICD-10-CM | POA: Insufficient documentation

## 2021-03-07 DIAGNOSIS — T50902A Poisoning by unspecified drugs, medicaments and biological substances, intentional self-harm, initial encounter: Secondary | ICD-10-CM

## 2021-03-07 DIAGNOSIS — T391X2A Poisoning by 4-Aminophenol derivatives, intentional self-harm, initial encounter: Secondary | ICD-10-CM | POA: Insufficient documentation

## 2021-03-07 DIAGNOSIS — Z7982 Long term (current) use of aspirin: Secondary | ICD-10-CM | POA: Insufficient documentation

## 2021-03-07 DIAGNOSIS — E876 Hypokalemia: Secondary | ICD-10-CM | POA: Insufficient documentation

## 2021-03-07 DIAGNOSIS — Z20822 Contact with and (suspected) exposure to covid-19: Secondary | ICD-10-CM | POA: Insufficient documentation

## 2021-03-07 DIAGNOSIS — Z79899 Other long term (current) drug therapy: Secondary | ICD-10-CM | POA: Insufficient documentation

## 2021-03-07 LAB — BASIC METABOLIC PANEL
Anion gap: 10 (ref 5–15)
Anion gap: 9 (ref 5–15)
BUN: 5 mg/dL — ABNORMAL LOW (ref 6–20)
BUN: 6 mg/dL (ref 6–20)
CO2: 27 mmol/L (ref 22–32)
CO2: 27 mmol/L (ref 22–32)
Calcium: 9.1 mg/dL (ref 8.9–10.3)
Calcium: 9.3 mg/dL (ref 8.9–10.3)
Chloride: 101 mmol/L (ref 98–111)
Chloride: 101 mmol/L (ref 98–111)
Creatinine, Ser: 0.56 mg/dL — ABNORMAL LOW (ref 0.61–1.24)
Creatinine, Ser: 0.65 mg/dL (ref 0.61–1.24)
GFR, Estimated: >60 mL/min (ref >60)
GFR, Estimated: >60 mL/min (ref >60)
Glucose, Bld: 90 mg/dL (ref 70–99)
Glucose, Bld: 105 mg/dL — ABNORMAL HIGH (ref 70–99)
Potassium: 3.1 mmol/L — ABNORMAL LOW (ref 3.5–5.1)
Potassium: 3.5 mmol/L (ref 3.5–5.1)
Sodium: 138 mmol/L (ref 135–145)
Sodium: 137 mmol/L (ref 135–145)

## 2021-03-07 LAB — CBC WITH DIFFERENTIAL/PLATELET
Abs Immature Granulocytes: 0.04 10*3/uL (ref 0.00–0.07)
Basophils Absolute: 0.1 10*3/uL (ref 0.0–0.1)
Basophils Relative: 1 %
Eosinophils Absolute: 0.1 10*3/uL (ref 0.0–0.5)
Eosinophils Relative: 1 %
HCT: 44.7 % (ref 39.0–52.0)
Hemoglobin: 14.7 g/dL (ref 13.0–17.0)
Immature Granulocytes: 0 %
Lymphocytes Relative: 24 %
Lymphs Abs: 2.2 10*3/uL (ref 0.7–4.0)
MCH: 28.5 pg (ref 26.0–34.0)
MCHC: 32.9 g/dL (ref 30.0–36.0)
MCV: 86.6 fL (ref 80.0–100.0)
Monocytes Absolute: 0.7 10*3/uL (ref 0.1–1.0)
Monocytes Relative: 8 %
Neutro Abs: 6.3 10*3/uL (ref 1.7–7.7)
Neutrophils Relative %: 66 %
Platelets: 232 10*3/uL (ref 150–400)
RBC: 5.16 MIL/uL (ref 4.22–5.81)
RDW: 14.2 % (ref 11.5–15.5)
WBC: 9.4 10*3/uL (ref 4.0–10.5)
nRBC: 0 % (ref 0.0–0.2)

## 2021-03-07 LAB — RESP PANEL BY RT-PCR (FLU A&B, COVID) ARPGX2
Influenza A by PCR: NEGATIVE
Influenza B by PCR: NEGATIVE
SARS Coronavirus 2 by RT PCR: NEGATIVE

## 2021-03-07 LAB — COMPREHENSIVE METABOLIC PANEL
ALT: 19 U/L (ref 0–44)
AST: 23 U/L (ref 15–41)
Albumin: 4.8 g/dL (ref 3.5–5.0)
Alkaline Phosphatase: 36 U/L — ABNORMAL LOW (ref 38–126)
Anion gap: 10 (ref 5–15)
BUN: 5 mg/dL — ABNORMAL LOW (ref 6–20)
CO2: 26 mmol/L (ref 22–32)
Calcium: 9.3 mg/dL (ref 8.9–10.3)
Chloride: 102 mmol/L (ref 98–111)
Creatinine, Ser: 0.7 mg/dL (ref 0.61–1.24)
GFR, Estimated: >60 mL/min (ref >60)
Glucose, Bld: 85 mg/dL (ref 70–99)
Potassium: 2.8 mmol/L — ABNORMAL LOW (ref 3.5–5.1)
Sodium: 138 mmol/L (ref 135–145)
Total Bilirubin: 1.7 mg/dL — ABNORMAL HIGH (ref 0.3–1.2)
Total Protein: 8.1 g/dL (ref 6.5–8.1)

## 2021-03-07 LAB — ETHANOL: Alcohol, Ethyl (B): 16 mg/dL — ABNORMAL HIGH (ref <10)

## 2021-03-07 LAB — URINALYSIS, ROUTINE W REFLEX MICROSCOPIC
Bacteria, UA: NONE SEEN
Bilirubin Urine: NEGATIVE
Glucose, UA: NEGATIVE mg/dL
Hgb urine dipstick: NEGATIVE
Ketones, ur: NEGATIVE mg/dL
Leukocytes,Ua: NEGATIVE
Nitrite: NEGATIVE
Protein, ur: 100 mg/dL — AB
Specific Gravity, Urine: 1.01 (ref 1.005–1.030)
pH: 7 (ref 5.0–8.0)

## 2021-03-07 LAB — TROPONIN I (HIGH SENSITIVITY)
Troponin I (High Sensitivity): 4 ng/L (ref <18)
Troponin I (High Sensitivity): 4 ng/L (ref <18)

## 2021-03-07 LAB — SALICYLATE LEVEL
Salicylate Lvl: <7 mg/dL — ABNORMAL LOW (ref 7.0–30.0)
Salicylate Lvl: <7 mg/dL — ABNORMAL LOW (ref 7.0–30.0)

## 2021-03-07 LAB — RAPID URINE DRUG SCREEN, HOSP PERFORMED
Amphetamines: NOT DETECTED
Barbiturates: NOT DETECTED
Benzodiazepines: NOT DETECTED
Cocaine: NOT DETECTED
Opiates: NOT DETECTED
Tetrahydrocannabinol: NOT DETECTED

## 2021-03-07 LAB — ACETAMINOPHEN LEVEL
Acetaminophen (Tylenol), Serum: <10 ug/mL — ABNORMAL LOW (ref 10–30)
Acetaminophen (Tylenol), Serum: <10 ug/mL — ABNORMAL LOW (ref 10–30)

## 2021-03-07 LAB — CBG MONITORING, ED: Glucose-Capillary: 98 mg/dL (ref 70–99)

## 2021-03-07 LAB — MAGNESIUM: Magnesium: 2 mg/dL (ref 1.7–2.4)

## 2021-03-07 MED ORDER — POTASSIUM CHLORIDE CRYS ER 20 MEQ PO TBCR
60.0000 meq | EXTENDED_RELEASE_TABLET | Freq: Once | ORAL | Status: AC
  Administered 2021-03-07: 60 meq via ORAL
  Filled 2021-03-07: qty 3

## 2021-03-07 NOTE — ED Notes (Addendum)
Patient ask sitter to give white iphone charger to his girlfriend the charger was hers Delorise Shiner the tech was my witness

## 2021-03-07 NOTE — ED Triage Notes (Signed)
Patient BIB [facility] from home. Patient took 6 250mg  acetaminophen, 250mg  aspirin, 65mg  caffeine in attempts to kill himself. Patient sent a text to his family member saying "I love all yall I swear im sorry make sure yall visit me." With a picture of 5 excedrine migraine pills. Patient keeps calling out for his mom, he said he saw his mom, who passed away in 10/29/2022. Patient refused blood work from EMS.

## 2021-03-07 NOTE — ED Notes (Signed)
Pt resting quietly with eyes closed. Attached to cardiac monitor x2. Vss.

## 2021-03-07 NOTE — ED Notes (Signed)
Pt resting quietly with eyes closed. Attached to cardiac monitor x2. VSS.

## 2021-03-07 NOTE — ED Notes (Signed)
This nurse rounding on patients at 1115. Noticed patient was not in room. Spoke with patients primary nurse. Primary nurse and this nurse notified security immediately and began to search for patient. Patient unable to be located. Triage, registration, all ED staff notified of patient elopement.   Nurse tech Natalia Leatherwood approached this nurse and notified this nurse that patient asked NT for his cell phone, NT gave patient his bag of belongings. Patient then changed into his regular clothes and left ED undetected.  Security Lee notified [employer].

## 2021-03-07 NOTE — ED Triage Notes (Signed)
Pt bib [employer] s/p eloping from department while under IVC.

## 2021-03-07 NOTE — ED Notes (Signed)
This nurse notified AC that IVC patient returned by [employer]. This nurse requested sitter for patient due to previous elopement from ED this day.

## 2021-03-07 NOTE — ED Notes (Signed)
Security to notify [employer] at this time.

## 2021-03-07 NOTE — ED Notes (Addendum)
Pt resting quietly with eyes closed. Attached to cardiac monitor x2. VSS. No sitter assigned to bedside at this time.

## 2021-03-07 NOTE — ED Provider Notes (Signed)
[facility] DEPT Provider Note   CSN: [id number] Arrival date & time: 03/07/21  1416     History  Chief Complaint  Patient presents with   IVC    Daniel Higgins is a 26 y.o. male.  HPI Patient presents for resumption of psychiatric care.  He was seen in the ED early this morning for depressive symptoms and suicidal ideation.  He had recently made gestures to family members about plan and intent of overdosing on pills.  This included pictures sent by text message of pills in his hand.  He was evaluated in the ED and medically cleared.  TTS team evaluated the patient and feels that he does meet criteria for inpatient psychiatric care.  Shortly thereafter, patient eloped from the emergency department.  He returned to the emergency department in custody of police.  Patient reports that he was unaware that he was prohibited from leaving the ED earlier today.  He reports that he went home and he was just at home with his kids.  He denies any advance or drug use since he eloped from the ED.  Unfortunate states that there was a male at his place of residence to stated that he was not there.  Patient does have a monitor on and they were able to identify his location at that residence.  They found him in a closet, hiding under clothes.  Other than that, he has been cooperative with police officers.  Patient currently denies any SI, HI, or AVH.  He denies any physical complaints.  He states that the events that led to his presentation in the ED early this morning were mistaken by his family members.    Home Medications Prior to Admission medications   Medication Sig Start Date End Date Taking? Authorizing Provider  aspirin-acetaminophen-caffeine (EXCEDRIN MIGRAINE) [phone] MG tablet Take 2 tablets by mouth every 6 (six) hours as needed for headache.   Yes [provider]  fluticasone (FLONASE) 50 MCG/ACT nasal spray Place 2 sprays into both nostrils  daily. Patient not taking: Reported on 02/10/2019 06/14/18 12/12/19  Julianne Rice, MD  loratadine (CLARITIN) 10 MG tablet Take 1 tablet (10 mg total) by mouth daily. Patient not taking: Reported on 02/10/2019 06/14/18 12/12/19  Julianne Rice, MD      Allergies    Other    Review of Systems   Review of Systems  Constitutional:  Negative for activity change, appetite change, chills, fatigue and fever.  HENT:  Negative for congestion, ear pain and sore throat.   Eyes:  Negative for pain and visual disturbance.  Respiratory:  Negative for cough, chest tightness and shortness of breath.   Cardiovascular:  Negative for chest pain and palpitations.  Gastrointestinal:  Negative for abdominal pain, nausea and vomiting.  Genitourinary:  Negative for dysuria, flank pain and hematuria.  Musculoskeletal:  Negative for arthralgias, back pain, myalgias and neck pain.  Skin:  Negative for color change and rash.  Neurological:  Negative for dizziness, seizures, syncope, weakness and numbness.  Psychiatric/Behavioral:  Negative for confusion, decreased concentration, hallucinations, self-injury and suicidal ideas. The patient is not nervous/anxious.   All other systems reviewed and are negative.  Physical Exam Updated Vital Signs BP 114/72 (BP Location: Left Arm)    Pulse 84    Temp 98.3 F (36.8 C) (Oral)    Resp 14    SpO2 100%  Physical Exam Vitals and nursing note reviewed.  Constitutional:      General: He is  not in acute distress.    Appearance: Normal appearance. He is well-developed. He is not ill-appearing, toxic-appearing or diaphoretic.  HENT:     Head: Normocephalic and atraumatic.     Right Ear: External ear normal.     Left Ear: External ear normal.     Nose: Nose normal.     Mouth/Throat:     Mouth: Mucous membranes are moist.     Pharynx: Oropharynx is clear.  Eyes:     Conjunctiva/sclera: Conjunctivae normal.  Cardiovascular:     Rate and Rhythm: Normal rate and  regular rhythm.     Heart sounds: No murmur heard. Pulmonary:     Effort: Pulmonary effort is normal. No respiratory distress.  Abdominal:     Palpations: Abdomen is soft.     Tenderness: There is no abdominal tenderness.  Musculoskeletal:        General: No swelling.     Cervical back: Neck supple.     Right lower leg: No edema.     Left lower leg: No edema.  Skin:    General: Skin is warm and dry.     Capillary Refill: Capillary refill takes less than 2 seconds.     Coloration: Skin is not jaundiced or pale.  Neurological:     General: No focal deficit present.     Mental Status: He is alert and oriented to person, place, and time.     Cranial Nerves: No cranial nerve deficit.     Sensory: No sensory deficit.     Motor: No weakness.     Coordination: Coordination normal.  Psychiatric:        Attention and Perception: Perception normal.        Mood and Affect: Mood and affect normal.        Speech: Speech normal. Speech is not rapid and pressured or slurred.        Behavior: Behavior normal. Behavior is cooperative.        Thought Content: Thought content normal. Thought content does not include homicidal or suicidal ideation.    ED Results / Procedures / Treatments   Labs (all labs ordered are listed, but only abnormal results are displayed) Labs Reviewed  BASIC METABOLIC PANEL - Abnormal; Notable for the following components:      Result Value   Glucose, Bld 105 (*)    All other components within normal limits    EKG None  Radiology DG Chest 1 View  Result Date: 03/07/2021 CLINICAL DATA:  Chest pain. EXAM: CHEST  1 VIEW COMPARISON:  Chest radiograph dated 08/14/2020. FINDINGS: No focal consolidation, pleural effusion, pneumothorax. The cardiac silhouette is within normal limits. No acute osseous pathology. IMPRESSION: No active disease. Electronically Signed   By: Anner Crete M.D.   On: 03/07/2021 03:30    Procedures Procedures    Medications Ordered in  ED Medications - No data to display  ED Course/ Medical Decision Making/ A&P                           Medical Decision Making  Patient is a 26 year old male who presents to the ED after eloping earlier today.  He was previously IVC and awaiting inpatient psychiatric care.  Since he left the ED around midday, he states that he simply went home and was spending time with his kids.  He denies any illicit drug ingestions since he left the ED.  He denies new symptoms.  He denies any current SI, HI, or AVH.  I reviewed the EMR, regarding the patient's recent presentation to the ED.  It does appear that he was making concerning statements and text messages to family that suggest suicidal ideation with plan and intent.  When he was evaluated by TTS, they did feel that he would benefit from inpatient psychiatric care.  I reviewed the patient's recent lab work, which was notable for hypokalemia.  BMP was repeated here in the ED and patient's potassium has normalized.  Patient currently denies any symptoms and is well-appearing on exam.  He was informed that he will need to remain in the ED for further psychiatric reevaluation and possible treatments.  Patient not currently taking any home medications.  Patient denies frequent alcohol use.  TTS was consulted.  Patient remained in the ED awaiting psychiatric reevaluation without any behavioral disturbances.         Final Clinical Impression(s) / ED Diagnoses Final diagnoses:  Suicidal ideation    Rx / DC Orders ED Discharge Orders     None         Godfrey Pick, MD 03/08/21 1234

## 2021-03-07 NOTE — ED Provider Notes (Addendum)
[facility] DEPT Provider Note   CSN: [id number] Arrival date & time: 03/07/21  0253     History  Chief Complaint  Patient presents with   Suicidal   Drug Overdose    Daniel Higgins is a 26 y.o. male.  HPI Patient is a 26 year old male who presents to the emergency department via [facility] due to suicidal ideation as well as a possible suicide attempt.  Patient reached out to his relatives earlier tonight sending them a photo of his hand filled with 5 Excedrin migraines.  Upon arrival patient is A&O x3.  He continues to ask if his "mother can join him in the room" and also asks [place] to "call his mother".  EMS notified us that his mother passed away last 11/01/2022 and he has been dealing with increasing depression due to this.  He sometimes believes that his mother is present.  Patient tells me that he has having some chest pain.  He does not provide any additional details.  He also states that he was drinking alcohol tonight but does not tell us the amount.  He denies any other physical complaints.    Home Medications Prior to Admission medications   Medication Sig Start Date End Date Taking? Authorizing Provider  benzonatate (TESSALON) 100 MG capsule Take 1 capsule (100 mg total) by mouth every 8 (eight) hours. 02/22/20   Henderly, Britni A, PA-C  ibuprofen (ADVIL) 600 MG tablet Take 1 tablet (600 mg total) by mouth every 6 (six) hours as needed. 12/12/19   Lamptey, Myrene Galas, MD  methocarbamol (ROBAXIN) 500 MG tablet Take 1 tablet (500 mg total) by mouth 2 (two) times daily. 12/12/19   Chase Picket, MD  fluticasone (FLONASE) 50 MCG/ACT nasal spray Place 2 sprays into both nostrils daily. Patient not taking: Reported on 02/10/2019 06/14/18 12/12/19  Julianne Rice, MD  loratadine (CLARITIN) 10 MG tablet Take 1 tablet (10 mg total) by mouth daily. Patient not taking: Reported on 02/10/2019 06/14/18 12/12/19  Julianne Rice, MD      Allergies    Other     Review of Systems   Review of Systems  All other systems reviewed and are negative. Ten systems reviewed and are negative for acute change, except as noted in the HPI.   Physical Exam Updated Vital Signs BP 108/84 (BP Location: Left Arm)    Pulse 70    Temp 98.4 F (36.9 C) (Oral)    Resp 16    Ht '5\' 11"'  (1.803 m)    Wt 61.2 kg    SpO2 100%    BMI 18.82 kg/m  Physical Exam Vitals and nursing note reviewed.  Constitutional:      General: He is not in acute distress.    Appearance: Normal appearance. He is not ill-appearing, toxic-appearing or diaphoretic.     Comments: Tearful during my exam.  HENT:     Head: Normocephalic and atraumatic.     Right Ear: External ear normal.     Left Ear: External ear normal.     Nose: Nose normal.     Mouth/Throat:     Mouth: Mucous membranes are moist.     Pharynx: Oropharynx is clear. No oropharyngeal exudate or posterior oropharyngeal erythema.  Eyes:     Extraocular Movements: Extraocular movements intact.  Cardiovascular:     Rate and Rhythm: Regular rhythm. Tachycardia present.     Pulses: Normal pulses.     Heart sounds: Normal heart sounds. No  murmur heard.   No friction rub. No gallop.  Pulmonary:     Effort: Pulmonary effort is normal. No respiratory distress.     Breath sounds: Normal breath sounds. No stridor. No wheezing, rhonchi or rales.  Abdominal:     General: Abdomen is flat.     Palpations: Abdomen is soft.     Tenderness: There is no abdominal tenderness.  Musculoskeletal:        General: Normal range of motion.     Cervical back: Normal range of motion and neck supple. No tenderness.  Skin:    General: Skin is warm and dry.  Neurological:     General: No focal deficit present.     Mental Status: He is alert and oriented to person, place, and time.  Psychiatric:        Attention and Perception: He is inattentive.        Mood and Affect: Mood is depressed. Affect is tearful.        Speech: Speech is delayed.         Behavior: Behavior is withdrawn.        Thought Content: Thought content includes suicidal ideation. Thought content includes suicidal plan.   ED Results / Procedures / Treatments   Labs (all labs ordered are listed, but only abnormal results are displayed) Labs Reviewed  COMPREHENSIVE METABOLIC PANEL - Abnormal; Notable for the following components:      Result Value   Potassium 2.8 (*)    BUN 5 (*)    Alkaline Phosphatase 36 (*)    Total Bilirubin 1.7 (*)    All other components within normal limits  SALICYLATE LEVEL - Abnormal; Notable for the following components:   Salicylate Lvl <9.1 (*)    All other components within normal limits  ACETAMINOPHEN LEVEL - Abnormal; Notable for the following components:   Acetaminophen (Tylenol), Serum <10 (*)    All other components within normal limits  ETHANOL - Abnormal; Notable for the following components:   Alcohol, Ethyl (B) 16 (*)    All other components within normal limits  URINALYSIS, ROUTINE W REFLEX MICROSCOPIC - Abnormal; Notable for the following components:   Protein, ur 100 (*)    All other components within normal limits  SALICYLATE LEVEL - Abnormal; Notable for the following components:   Salicylate Lvl <6.3 (*)    All other components within normal limits  ACETAMINOPHEN LEVEL - Abnormal; Notable for the following components:   Acetaminophen (Tylenol), Serum <10 (*)    All other components within normal limits  BASIC METABOLIC PANEL - Abnormal; Notable for the following components:   Potassium 3.1 (*)    BUN 5 (*)    Creatinine, Ser 0.56 (*)    All other components within normal limits  RESP PANEL BY RT-PCR (FLU A&B, COVID) ARPGX2  RAPID URINE DRUG SCREEN, HOSP PERFORMED  CBC WITH DIFFERENTIAL/PLATELET  MAGNESIUM  CBG MONITORING, ED  TROPONIN I (HIGH SENSITIVITY)  TROPONIN I (HIGH SENSITIVITY)   EKG None  Radiology DG Chest 1 View  Result Date: 03/07/2021 CLINICAL DATA:  Chest pain. EXAM: CHEST  1 VIEW  COMPARISON:  Chest radiograph dated 08/14/2020. FINDINGS: No focal consolidation, pleural effusion, pneumothorax. The cardiac silhouette is within normal limits. No acute osseous pathology. IMPRESSION: No active disease. Electronically Signed   By: Anner Crete M.D.   On: 03/07/2021 03:30    Procedures Procedures   Medications Ordered in ED Medications  potassium chloride SA (KLOR-CON M) CR tablet 60  mEq (60 mEq Oral Given 03/07/21 0609)    ED Course/ Medical Decision Making/ A&P                           Medical Decision Making Pt is a 26 y.o. male who presents to the emergency department via EMS due to suicidal ideation.  Patient endorsed EMS drinking alcohol tonight as well as taking 5 Excedrin's.  Labs: CBC without abnormalities. CMP with a potassium of 2.8, BUN of 5, alk phos of 36, total bilirubin of 1.7.. Troponin of 4. Salicylate less than 7 with a repeat less than 7. Acetaminophen less than 10 with a repeat less than 10. Ethanol 16. UA with 100 protein. Respiratory panel is pending. Magnesium levels pending.  Imaging: Chest x-ray is negative.  I, Rayna Sexton, PA-C, personally reviewed and evaluated these images and lab results as part of my medical decision-making.  Patient's mother passed away last year.  EMS notes that patient has been having difficulty with this and at times believes she is present.  On my initial exam patient continually asking for his mother and requesting that she be at bedside with him.  He also request on multiple occasions that we call his mother.  Nursing staff discussed the patient with poison control who recommended screening labs as well as acetaminophen and salicylate levels.  They recommended repeating these levels at 6 AM as well.  Initial salicylate is less than 7 and acetaminophen less than 10.  Patient did endorse drinking alcohol tonight.  Ethanol was 16.  Patient does not appear intoxicated.  CMP shows a potassium of 2.8.  This was  repeated showing a potassium of 3.1.  Magnesium level is pending.  Patient given 60 mEq of Klor-Con.  Feel that patient is medically cleared at this time.  TTS consult has been placed.  Disposition pending based on TTS recommendations.  Patient is under involuntary commitment.  Note: Portions of this report may have been transcribed using voice recognition software. Every effort was made to ensure accuracy; however, inadvertent computerized transcription errors may be present.   Final Clinical Impression(s) / ED Diagnoses Final diagnoses:  Suicidal ideation  Intentional drug overdose, initial encounter [facility])  Involuntary commitment  Hypokalemia   Rx / DC Orders ED Discharge Orders     None         Rayna Sexton, PA-C 03/07/21 0558    Rayna Sexton, PA-C 03/07/21 0645    Quintella Reichert, MD 03/07/21 [phone]

## 2021-03-07 NOTE — ED Notes (Signed)
Patient originally upset and wanted to leave. Patient did not want to give up his phone on arrival. Patient redirected and informed about the IVC process and psychiatric consultation. Patient now cooperative.

## 2021-03-07 NOTE — ED Notes (Signed)
This nurse spoke with security officer, Nedra Hai, regarding security's review of cameras. Per Nedra Hai, pt was given belongings by ED tech Natalia Leatherwood, for unknown reasons, and pt then exited exam room in plain clothing and exited ED entrance toward valet parking area, and continued towards [employer]. Charge RN, [employer], notified and aware.

## 2021-03-07 NOTE — ED Notes (Addendum)
Patient had 1 belonging bag that he requested to be sent home with his girlfriend, Rolland Porter. This included his phone and credit card. Patient still has 1 patient belonging bag across from room 4.

## 2021-03-07 NOTE — ED Notes (Addendum)
Patient does not want grandma, Erie Noe, notified about anything. He wants his sister notified first then girlfriend. Patient wants his sister to be called after the psychiatrist talks with him about the plan for inpatient psych.

## 2021-03-07 NOTE — ED Notes (Addendum)
During nurse rounding, following pt's TTS assessment, pt's exam room empty, with pt missing. ED bathrooms checked and also empty. Security immediately notified and to check security cameras. Consulting civil engineer, [employer] notified.

## 2021-03-07 NOTE — ED Notes (Signed)
Poison Control called. They advised this medication can cause nausea and jittery, obtain salicylate now and repeat salicylate, acetaminophen, and BMP at 0600.

## 2021-03-07 NOTE — ED Notes (Signed)
Patient changed out into burgandy scrubs. Patient has 1 patient belonging bag located across from room 18.

## 2021-03-07 NOTE — ED Notes (Signed)
Patient anxious about how long he will stay at the psych facility. Patient continuing to ask if he has to go there. But encouraged that this is a safe place for him.

## 2021-03-07 NOTE — BH Assessment (Signed)
Comprehensive Clinical Assessment (CCA) Note  03/07/2021 Daniel Higgins [id number]  Chief Complaint:  Chief Complaint  Patient presents with   Suicidal   Drug Overdose   Visit Diagnosis:   F33.2 Major depressive disorder, Recurrent episode, Severe  Flowsheet Row ED from 03/07/2021 in [place] [facility]PT ED from 02/08/2021 in [facility] ED from 12/04/2020 in [place] [facility]  C-SSRS RISK CATEGORY High Risk No Risk No Risk      The patient demonstrates the following risk factors for suicide: Chronic risk factors for suicide include: psychiatric disorder of major depressive disorder, substance use disorder, and previous suicide attempts overdosing . Acute risk factors for suicide include: loss (financial, interpersonal, professional). Protective factors for this patient include: positive therapeutic relationship, responsibility to others (children, family), coping skills, and hope for the future. Considering these factors, the overall suicide risk at this point appears to be high. Patient is not appropriate for outpatient follow up.  Disposition: Gus Height NP, patient meets inpatient criteria.  Disposition discussed with Corrie Dandy.  RN reported that pt eloped and security have been notified.  Daniel Higgins is a 26 year old male who presents voluntarily to [facility] via [place] and unaccompanied.  Pt gave TTS permission to contact [name], [phone], unable to access, or leave message.  Pt reports to family members he took 6 250mg  acetaminophen, 250mg  aspirin, 65mg  caffeine in attempts to kill himself. Patient sent a text to his family member saying "I love all yall I swear im sorry make sure yall visit me." With a picture of 5 excedrine migraine pills. Patient keeps calling out for his mom, he said he saw his mom, who passed away in 11-07-2022.  When the Clinician asked if he wants to hurt himself,  he denies SI, HI and AVH.  Pt reports that he  was celebrating a friend's birthday party, drank a half cup of wine and took three Tylenol, "my head started to hurt, I called the EMS". Pt reports "I was calling out for my godmother [name], no phone number available, who is deaf and they thought I was calling out for my deceased mother".  Pt acknowledged that he has been sad, crying, anxious and feeling tired.  Pt acknowledged that he is eating fine and is sleeping eight hours during the night.  Pt denies using any other substance used.  Pt identifies his primary stressor as processing the death of his mother.  Pt reports that he lives with his three kids and with his girlfriend, [name].  Pt reports that he is currently attending [employer], majoring in [employer].  Pt reports that he is employed at [employer], "I am scheduled to work today at 12 pm". Pt denise family history of mental illness; also, denies family history of substance used.  Pt denies any current legal problems.  Pt report no guns in the home.  Pt says he is currently receiving weekly outpatient therapy with a Ms Mancel Bale in [place], unable to identify agency.  Pt denies any medication management.  Pt reports no previous inpatient psychiatric hospitalization.  Pt is dressed in scrubs, alert, oriented x 5 with normal speech and restless motor behavior.  Eye contact is good.  Pt mood is anxious and affect depressed.  Thought process relevant.  Pt's insight is lacking and judgment is impaired.  There is no indication Pt is currently responding to internal stimuli or experiencing delusional thought content.  Pt was cooperative throughout assessment.    CCA Screening, Triage and Referral (STR)  Patient Reported Information How did you hear about [place]? Other (Comment) ([facility])  What Is the Reason for Your Visit/Call Today? SI, Depression  How Long Has This Been Causing You Problems? <Week  What Do You Feel  Would Help You the Most Today? Alcohol or Drug Use Treatment; Treatment for Depression or other mood problem   Have You Recently Had Any Thoughts About Hurting Yourself? No  Are You Planning to Commit Suicide/Harm Yourself At This time? No   Have you Recently Had Thoughts About [place]? No  Are You Planning to Harm Someone at This Time? No  Explanation: No data recorded  Have You Used Any Alcohol or Drugs in the Past 24 Hours? Yes  How Long Ago Did You Use Drugs or Alcohol? No data recorded What Did You Use and How Much? Alcohol   Do You Currently Have a Therapist/Psychiatrist? Yes  Name of Therapist/Psychiatrist: Ms Mancel Bale, Therapist [facility] [place])   Have You Been Recently Discharged From Any Office Practice or Programs? No  Explanation of Discharge From Practice/Program: No data recorded    CCA Screening Triage Referral Assessment Type of Contact: Tele-Assessment  Telemedicine Service Delivery: Telemedicine service delivery: This service was provided via telemedicine using a 2-way, interactive audio and video technology  Is this Initial or Reassessment? Initial Assessment  Date Telepsych consult ordered in [facility]:  03/07/21  Time Telepsych consult ordered in [facility]:  No data recorded Location of Assessment: [facility]  Provider Location: [facility] Services   Collateral Involvement: No collateral involvement   Does Patient Have a [place]? No data recorded Name and Contact of Legal Guardian: No data recorded If Minor and Not Living with Parent(s), Who has Custody? n/a  Is CPS involved or ever been involved? Never  Is APS involved or ever been involved? Never   Patient Determined To Be At Risk for Harm To Self or Others Based on Review of Patient Reported Information or Presenting Complaint? Yes, for Self-Harm  Method: No data recorded Availability of Means: No data recorded Intent: No data recorded Notification Required:  No data recorded Additional Information for Danger to Others Potential: No data recorded Additional Comments for Danger to Others Potential: No data recorded Are There Guns or Other Weapons in Your Home? No data recorded Types of Guns/Weapons: No data recorded Are These Weapons Safely Secured?                            No data recorded Who Could Verify You Are Able To Have These Secured: No data recorded Do You Have any Outstanding Charges, Pending Court Dates, Parole/Probation? No data recorded Contacted To Inform of Risk of Harm To Self or Others: Family/Significant Other:    Does Patient Present under Involuntary Commitment? No  IVC Papers Initial File Date: No data recorded  [place] of Residence: [place]   Patient Currently Receiving the Following Services: Individual Therapy   Determination of Need: Urgent (48 hours)   Options For Referral: Medication Management; [facility]     CCA Biopsychosocial Patient Reported Schizophrenia/Schizoaffective Diagnosis in Past: No   Strengths: listening   Mental Health Symptoms Depression:   Change in energy/activity; Fatigue; Hopelessness   Duration of Depressive symptoms:  Duration of Depressive Symptoms: Less than two weeks   Mania:   None   Anxiety:  Restlessness; Tension; Worrying   Psychosis:   None   Duration of Psychotic symptoms:    Trauma:   Re-experience of traumatic event (Pt reports that his mother died in 11-10-20)   Obsessions:   None   Compulsions:   None   Inattention:   None   Hyperactivity/Impulsivity:   None   Oppositional/Defiant Behaviors:   None   Emotional Irregularity:   Unstable self-image; Chronic feelings of emptiness   Other Mood/Personality Symptoms:   depressed    Mental Status Exam Appearance and self-care  Stature:   Average   Weight:   Average weight   Clothing:   -- (Pt dressed in scrubs)   Grooming:   Normal   Cosmetic use:   None    Posture/gait:   Normal   Motor activity:   Agitated; Restless; Repetitive   Sensorium  Attention:   Inattentive   Concentration:   Normal   Orientation:   Object; Person; Place; Situation; Time   Recall/memory:   Normal   Affect and Mood  Affect:   Anxious; Depressed; Appropriate   Mood:   Anxious; Depressed   Relating  Eye contact:   None   Facial expression:   Responsive; Tense   Attitude toward examiner:   Cooperative   Thought and Language  Speech flow:  Clear and Coherent   Thought content:   Appropriate to Mood and Circumstances   Preoccupation:   None   Hallucinations:   None   Organization:  No data recorded  [employer] of Knowledge:   Fair   Intelligence:   Average   Abstraction:   Functional   Judgement:   Impaired   Reality Testing:   Realistic   Insight:   Lacking   Decision Making:   Impulsive   Social Functioning  Social Maturity:   Impulsive   Social Judgement:   Heedless; Impropriety   Stress  Stressors:   Grief/losses   Coping Ability:   Programme researcher, broadcasting/film/video Deficits:   Decision making   Supports:   Family     Religion: Religion/Spirituality How Might This Affect Treatment?: UTA  Leisure/Recreation: Leisure / Recreation Do You Have Hobbies?: Yes Leisure and Hobbies: music journaling  Exercise/Diet: Exercise/Diet Do You Exercise?: Yes What Type of Exercise Do You Do?: Weight Training How Many Times a Week Do You Exercise?: 1-3 times a week Have You Gained or Lost A Significant Amount of Weight in the Past Six Months?: No Do You Follow a Special Diet?: No Do You Have Any Trouble Sleeping?: No   CCA Employment/Education Employment/Work Situation: Employment / Work Situation Employment Situation: Employed Work Stressors: Architectural technologist has Been Impacted by Current Illness: No Has Patient ever Been in Passenger transport manager?: No  Education: Education Is Patient Currently  Attending School?: Yes School Currently Attending: [employer] Last Grade Completed: 14 Did You Nutritional therapist?: Yes What Type of College Degree Do you Have?: American Sign Language, BS, Pt states he will be graduating in 2024 Did You Have An Individualized Education Program (IIEP): No Did You Have Any Difficulty At School?: No Patient's Education Has Been Impacted by Current Illness:  (UTA)   CCA Family/Childhood History Family and Relationship History: Family history Marital status: Single Does patient have children?: Yes How many children?: 3 How is patient's relationship with their children?: close  Childhood History:  Childhood History By whom was/is the patient raised?: Mother (Pt reports his mother died on 2022/11/11,  2022) Did patient suffer any verbal/emotional/physical/sexual abuse as a child?: No Did patient suffer from severe childhood neglect?: No Has patient ever been sexually abused/assaulted/raped as an adolescent or adult?: No Was the patient ever a victim of a crime or a disaster?: No Witnessed domestic violence?: No Has patient been affected by domestic violence as an adult?: No  Child/Adolescent Assessment:     CCA Substance Use Alcohol/Drug Use: Alcohol / Drug Use Pain Medications: See Medication Prescriptions: See Medication Over the Counter: See Medication History of alcohol / drug use?: Yes Longest period of sobriety (when/how long): UTA Negative Consequences of Use:  (UTA) Withdrawal Symptoms:  (UTA) Substance #1 Name of Substance 1: Alcohol 1 - Age of First Use: 24 1 - Amount (size/oz): 1/2 cup wine 1 - Frequency: UTA 1 - Duration: UTA 1 - Last Use / Amount: 03/06/21 1 - Method of Aquiring: UTA 1- Route of Use: drinking                       ASAM's:  Six Dimensions of Multidimensional Assessment  Dimension 1:  Acute Intoxication and/or Withdrawal Potential:   Dimension 1:  Description of individual's past and current  experiences of substance use and withdrawal: Pt reports that he does not drink or use substance, "I was celebrating my friend's birthday".  Dimension 2:  Biomedical Conditions and Complications:   Dimension 2:  Description of patient's biomedical conditions and  complications: UTA  Dimension 3:  Emotional, Behavioral, or Cognitive Conditions and Complications:  Dimension 3:  Description of emotional, behavioral, or cognitive conditions and complications: UTA  Dimension 4:  Readiness to Change:  Dimension 4:  Description of Readiness to Change criteria: UTA  Dimension 5:  Relapse, Continued use, or Continued Problem Potential:  Dimension 5:  Relapse, continued use, or continued problem potential critiera description: UTA  Dimension 6:  Recovery/Living Environment:  Dimension 6:  Recovery/Iiving environment criteria description: Pt reports that he lives his children and girlfrend in a safe place.  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   Primary spontaneous pneumothorax 10/24/2016     Referrals to Alternative Service(s): Referred to Alternative Service(s):   Place:   Date:   Time:    Referred to Alternative Service(s):   Place:   Date:   Time:    Referred to Alternative Service(s):   Place:   Date:   Time:    Referred to Alternative Service(s):   Place:   Date:   Time:     Animal nutritionist, Counselor  ASAM's:  Six Dimensions of Multidimensional  Assessment  Dimension 1:  Acute Intoxication and/or Withdrawal Potential:      Dimension 2:  Biomedical Conditions and Complications:      Dimension 3:  Emotional, Behavioral, or Cognitive Conditions and Complications:     Dimension 4:  Readiness to Change:     Dimension 5:  Relapse, Continued use, or Continued Problem Potential:     Dimension 6:  Recovery/Living Environment:     ASAM Severity Score:    ASAM Recommended  Level of Treatment:     Substance use Disorder (SUD)    Recommendations for Services/Supports/Treatments:    Discharge Disposition:    DSM5 Diagnoses: Patient Active Problem List   Diagnosis Date Noted   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

## 2021-03-07 NOTE — ED Notes (Signed)
Pt changing into wine colored scrubs.  [employer] at bedside.

## 2021-03-07 NOTE — ED Notes (Signed)
TTS assessment in progress. 

## 2021-03-07 NOTE — ED Notes (Signed)
Pt asked to use the phone at the desk, then asked for his bag to get a number out of his phone. I gave him his bag.

## 2021-03-08 ENCOUNTER — Inpatient Hospital Stay (HOSPITAL_COMMUNITY)
Admission: AD | Admit: 2021-03-08 | Discharge: 2021-03-10 | DRG: 897 | Disposition: A | Discharge status: Home / Self Care | Payer: Federal, State, Local not specified - Other | Source: Intra-hospital | Attending: Psychiatry | Admitting: Psychiatry

## 2021-03-08 ENCOUNTER — Encounter (HOSPITAL_COMMUNITY): Payer: Self-pay | Admitting: Urology

## 2021-03-08 DIAGNOSIS — F10929 Alcohol use, unspecified with intoxication, unspecified: Principal | ICD-10-CM | POA: Diagnosis present

## 2021-03-08 DIAGNOSIS — R45851 Suicidal ideations: Secondary | ICD-10-CM | POA: Insufficient documentation

## 2021-03-08 DIAGNOSIS — R780 Finding of alcohol in blood: Secondary | ICD-10-CM | POA: Diagnosis not present

## 2021-03-08 DIAGNOSIS — F1090 Alcohol use, unspecified, uncomplicated: Principal | ICD-10-CM | POA: Diagnosis present

## 2021-03-08 DIAGNOSIS — F332 Major depressive disorder, recurrent severe without psychotic features: Secondary | ICD-10-CM | POA: Diagnosis present

## 2021-03-08 MED ORDER — ALUM & MAG HYDROXIDE-SIMETH 200-200-20 MG/5ML PO SUSP: 30.0000 mL | ORAL | Status: DC | PRN

## 2021-03-08 MED ORDER — ACETAMINOPHEN 325 MG PO TABS: 650.0000 mg | ORAL_TABLET | Freq: Four times a day (QID) | ORAL | Status: DC | PRN

## 2021-03-08 MED ORDER — TRAZODONE HCL 50 MG PO TABS
50.0000 mg | ORAL_TABLET | Freq: Every evening | ORAL | Status: DC | PRN
  Filled 2021-03-08: qty 1

## 2021-03-08 MED ORDER — HYDROXYZINE HCL 25 MG PO TABS
25.0000 mg | ORAL_TABLET | Freq: Three times a day (TID) | ORAL | Status: DC | PRN
  Filled 2021-03-08: qty 1

## 2021-03-08 MED ORDER — MAGNESIUM HYDROXIDE 400 MG/5ML PO SUSP: 30.0000 mL | Freq: Every day | ORAL | Status: DC | PRN

## 2021-03-08 NOTE — BH Assessment (Signed)
Per Binnie Rail, [facility]: Pt accepted to [facility], room 305-1, under the service of Dr. Mason Jim. Number for RN report is [phone]. Pt can be transferred after 1200. Notified Dr Tilden Fossa and Felipa Furnace, RN of acceptance via secure message.   Pamalee Leyden, [facility], [facility] Triage Specialist [phone]

## 2021-03-08 NOTE — Tx Team (Signed)
Initial Treatment Plan 03/08/2021 2:27 PM Michel SHRIYAN ARAKAWA [id number]    PATIENT STRESSORS: Other: school, finances     PATIENT STRENGTHS: Ability for insight  Average or above average intelligence  Capable of independent living  Education administrator  Motivation for treatment/growth  Physical Health  Supportive family/friends  Work skills    PATIENT IDENTIFIED PROBLEMS: Intentional ingestion of pills    anxiety    school             DISCHARGE CRITERIA:  Improved stabilization in mood, thinking, and/or behavior Reduction of life-threatening or endangering symptoms to within safe limits Verbal commitment to aftercare and medication compliance  PRELIMINARY DISCHARGE PLAN: Outpatient therapy Return to previous living arrangement Return to previous work or school arrangements  PATIENT/FAMILY INVOLVEMENT: This treatment plan has been presented to and reviewed with the patient, Thelma Barge.  The patient has been given the opportunity to ask questions and make suggestions.  Shela Nevin, RN 03/08/2021, 2:27 PM

## 2021-03-08 NOTE — Progress Notes (Signed)
Patient is a 26 year old male who presented under IVC from [facility] for an intentional ingestion of 6 (250 mg) acetaminophen, 250 mg aspirin, 65 mg caffeine. According to a family member, pt had sent a text saying, "I love all yall I swear I'm sorry make sure yall visit me", with a picture of 5 Excederin migraine pills.. Pt reported that he wasn't suicidal, but had a bad headache after drinking some wine. Pt reported that he was drunk because he normally doesn't drink. Pt also denied drug use- BAL was 16 and UDS was negative. Pt reported that he is an [employer] student, and will be graduating this spring with a degree in ASL. Pt currently lives with his gf and 3 kids, and works at [employer]. Pt stated, "I have too much good stuff going on to hurt myself", and "I have 3 kids who need me." Pt stated, "I have a lot to be thankful for", and reported that he has a lot of support from family.  Patient presented with anxious affect, was calm, cooperative and polite during admission interview and assessment. VS monitored and recorded. Skin check revealed no abnormalities other than multiple tattoos, and an ankle monitor on his left ankle. Belongings searched and secured in locker. Patient was oriented to unit and schedule. . Pt currently denies SI/HI/AVH at this time. Lunch and po fluids provided. Q 15 min checks initiated for safety.

## 2021-03-08 NOTE — Progress Notes (Signed)
°   03/08/21 2328  Psych Admission Type (Psych Patients Only)  Admission Status Involuntary  Psychosocial Assessment  Patient Complaints Anxiety  Eye Contact Brief  Facial Expression Flat  Affect Appropriate to circumstance  Speech Logical/coherent  Interaction Assertive  Motor Activity Other (Comment) (WDL)  Appearance/Hygiene Unremarkable  Behavior Characteristics Appropriate to situation  Mood Anxious  Thought Process  Coherency WDL  Content WDL  Delusions None reported or observed  Perception WDL  Hallucination None reported or observed  Judgment Poor  Confusion None  Danger to Self  Current suicidal ideation? Denies  Danger to Others  Danger to Others None reported or observed

## 2021-03-08 NOTE — ED Notes (Signed)
Per Lavell Luster, [facility]: Pt accepted to [facility], room 305-1, under the service of Dr. Nelda Marseille. Number for RN report is [phone]. Pt can be transferred after 1200.

## 2021-03-08 NOTE — ED Notes (Signed)
SBAR given to [facility]. Sheriff called to transport patient to [facility].
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
## 2021-03-08 NOTE — Progress Notes (Deleted)
Patient signed a 72 hr request for discharge today at 1435

## 2021-03-09 ENCOUNTER — Encounter (HOSPITAL_COMMUNITY): Payer: Self-pay

## 2021-03-09 DIAGNOSIS — R780 Finding of alcohol in blood: Secondary | ICD-10-CM

## 2021-03-09 NOTE — Progress Notes (Signed)
°   03/09/21 2038  Psych Admission Type (Psych Patients Only)  Admission Status Involuntary  Psychosocial Assessment  Patient Complaints Anxiety  Eye Contact Brief  Facial Expression Flat  Affect Appropriate to circumstance  Speech Logical/coherent  Interaction Assertive  Motor Activity Other (Comment) (WDL)  Appearance/Hygiene Unremarkable  Behavior Characteristics Cooperative;Appropriate to situation  Mood Anxious  Thought Process  Coherency WDL  Content WDL  Delusions None reported or observed  Perception WDL  Hallucination None reported or observed  Judgment Poor  Confusion None  Danger to Self  Current suicidal ideation? Denies  Danger to Others  Danger to Others None reported or observed

## 2021-03-09 NOTE — [facility] Suicide Risk Assessment (Signed)
Suicide Risk Assessment  Admission Assessment    [facility] Admission Suicide Risk Assessment   Nursing information obtained from:  Patient Demographic factors:  Male, Adolescent or young adult Current Mental Status:  NA Loss Factors:  Loss of significant relationship Historical Factors:  NA Risk Reduction Factors:  Responsible for children under 27 years of age, Sense of responsibility to family, Employed, Living with another person, especially a relative, Positive social support, Positive therapeutic relationship  Total Time spent with patient: 30 minutes Principal Problem: Elevated ETOH level Diagnosis:  Principal Problem:   Elevated ETOH level  Subjective Data:  Daniel Higgins is a 26 year old male with no previous psychiatric history who initially presented to [facility] endorsing nausea and vomiting and was later transferred to [facility] for reported SI under IVC. Patient reports that his family came from [place] for the holidays and relieving over the weekend.  Patient reports that prior to them leaving he decided to drink alcohol for the first time. Patient reports that he unfortunately began having significant nausea, vomiting and headache once he was intoxicated.    Patient reports that he took a picture of 3 packets of Tylenol that he had received and told them that he had received Tylenol to help with his pain.  Patient reports that he also texted his cousins that he had enjoyed spending time with them and wished that they would come back and visit him another time.  Patient reports that he only took one of the 3 packets of Tylenol and reports there were 2 pills inside. Patient reports unfortunately he continued to have headache and was vomiting and his family called EMS due to his symptoms. Patient reports "I made the poor decision to go ahead and leave without being discharge."  Patient reports that when police arrived at his home he tried to hide in his closet until he learned that he had been IV seed  and had to return to the hospital.Patient reports that at no point was he ever suicidal and denies any history of previous self-injurious behavior.  Patient currently denies SI, HI and AVH.   Continued Clinical Symptoms:  Alcohol Use Disorder Identification Test Final Score (AUDIT): 1 The "Alcohol Use Disorders Identification Test", Guidelines for Use in Primary Care, Second Edition.  World Science writer [facility]). Score between 0-7:  no or low risk or alcohol related problems. Score between 8-15:  moderate risk of alcohol related problems. Score between 16-19:  high risk of alcohol related problems. Score 20 or above:  warrants further diagnostic evaluation for alcohol dependence and treatment.   CLINICAL FACTORS:  NA  Musculoskeletal: Strength & Muscle Tone: within normal limits Gait & Station: normal Patient leans: N/A  Psychiatric Specialty Exam:  Presentation  General Appearance: Appropriate for Environment; Casual  Eye Contact:Good  Speech:Clear and Coherent  Speech Volume:Normal  Handedness:No data recorded  Mood and Affect  Mood:Euthymic  Affect:Appropriate; Congruent   Thought Process  Thought Processes:Coherent  Descriptions of Associations:Intact  Orientation:Full (Time, Place and Person)  Thought Content:Logical  History of Schizophrenia/Schizoaffective disorder:No  Duration of Psychotic Symptoms:No data recorded Hallucinations:Hallucinations: None  Ideas of Reference:None  Suicidal Thoughts:Suicidal Thoughts: No  Homicidal Thoughts:Homicidal Thoughts: No   Sensorium  Memory:Immediate Good; Recent Good; Remote Good  Judgment:Fair  Insight:Fair   Executive Functions  Concentration:Good  Attention Span:Good  Recall:Good  Fund of Knowledge:Good  Language:Good   Psychomotor Activity  Psychomotor Activity:Psychomotor Activity: Normal   Assets  Assets:Communication Skills; Resilience; Social Support; Talents/Skills;  Housing  Sleep  Sleep:Sleep: Fair    Physical Exam: Physical Exam ROS Blood pressure (!) 84/62, pulse 83, temperature 97.8 F (36.6 C), resp. rate 16, height 5\' 11"  (1.803 m), weight 55.5 kg, SpO2 100 %. Body mass index is 17.07 kg/m.   COGNITIVE FEATURES THAT CONTRIBUTE TO RISK:  None    SUICIDE RISK:   Mild:  Suicidal ideation of limited frequency, intensity, duration, and specificity.  There are no identifiable plans, no associated intent, mild dysphoria and related symptoms, good self-control (both objective and subjective assessment), few other risk factors, and identifiable protective factors, including available and accessible social support.  PLAN OF CARE: Admit due to reported SI in ED and needs continued observation.      I certify that inpatient services furnished can reasonably be expected to improve the patient's condition.   PGY-2 , MD 03/09/2021, 5:29 PM

## 2021-03-09 NOTE — Progress Notes (Signed)
The patient attended the evening A.A.meeting and was appropriate.  

## 2021-03-09 NOTE — Group Note (Signed)
LCSW Group Therapy Note   Group Date: 03/09/2021 Start Time: 1300 End Time: 59  LCSW Aftercare Discharge Planning Group Note  03/09/2021   Type of Group and Topic: Psychoeducational Group: Discharge Planning  Participation Level: Active  Description of Group: Discharge planning group reviews patients anticipated discharge plans and assists patients to anticipate and address any barriers to wellness/recovery in the community. Suicide prevention education is reviewed with patients in group.  Therapeutic Goals 1. Patients will state their anticipated discharge plan and mental health aftercare 2. Patients will identify potential barriers to wellness in the community setting 3. Patients will engage in problem solving, solution focused discussion of ways to anticipate and address barriers to wellness/recovery  Summary of Patient Progress: Pt came in late and missed introductions. Pt was appropriate and participated during discussion.   Therapeutic Modalities: Motivational Interviewing   Mliss Fritz, Latanya Presser 03/09/2021  2:01 PM

## 2021-03-09 NOTE — Group Note (Signed)
Recreation Therapy Group Note   Group Topic:Stress Management  Group Date: 03/09/2021 Start Time: 0930 End Time: 1000 Facilitators: Caroll Rancher, LRT,CTRS Location: [address] Dayroom   Goal Area(s) Addresses:  Patient will actively participate in stress management techniques presented during session.  Patient will successfully identify benefit of practicing stress management post d/c.   Group Description: Guided Imagery. LRT provided education, instruction, and demonstration on practice of visualization via guided imagery. Patient was asked to participate in the technique introduced during session. LRT debriefed including topics of mindfulness, stress management and specific scenarios each patient could use these techniques. Patients were given suggestions of ways to access scripts post d/c and encouraged to explore Youtube and other apps available on smartphones, tablets, and computers.   Affect/Mood: Appropriate   Participation Level: Active   Participation Quality: Independent   Behavior: Appropriate   Speech/Thought Process: Focused   Insight: Good   Judgement: Good   Modes of Intervention: Script   Patient Response to Interventions:  Engaged   Education Outcome:  Acknowledges education and In group clarification offered    Clinical Observations/Individualized Feedback: Pt attended and participated despite numerous interruptions by staff.    Plan: Continue to engage patient in RT group sessions 2-3x/week.   Caroll Rancher, LRT,CTRS 03/09/2021 12:01 PM

## 2021-03-09 NOTE — Progress Notes (Signed)
PATIENT SIGNED VOLUNTARY ADMISSION AND CONSENT FOR TREATMENT FORM ON 03/09/2021 AT 1155.    (Per MD instructions)

## 2021-03-09 NOTE — BH IP Treatment Plan (Signed)
Interdisciplinary Treatment and Diagnostic Plan Update  03/09/2021 Time of Session: 10:30am Daniel Higgins MRN: [id number]  Principal Diagnosis: MDD (major depressive disorder), recurrent severe, without psychosis ([place])  Secondary Diagnoses: Principal Problem:   MDD (major depressive disorder), recurrent severe, without psychosis ([place])   Current Medications:  Current Facility-Administered Medications  Medication Dose Route Frequency Provider Last Rate Last Admin   acetaminophen (TYLENOL) tablet 650 mg  650 mg Oral Q6H PRN Ajibola, Ene A, NP       alum & mag hydroxide-simeth (MAALOX/MYLANTA) 200-200-20 MG/5ML suspension 30 mL  30 mL Oral Q4H PRN Ajibola, Ene A, NP       hydrOXYzine (ATARAX) tablet 25 mg  25 mg Oral TID PRN Ajibola, Ene A, NP       magnesium hydroxide (MILK OF MAGNESIA) suspension 30 mL  30 mL Oral Daily PRN Ajibola, Ene A, NP       traZODone (DESYREL) tablet 50 mg  50 mg Oral QHS PRN Ajibola, Ene A, NP       PTA Medications: Medications Prior to Admission  Medication Sig Dispense Refill Last Dose   aspirin-acetaminophen-caffeine (EXCEDRIN MIGRAINE) 250-250-65 MG tablet Take 2 tablets by mouth every 6 (six) hours as needed for headache.   03/07/2021   naproxen (NAPROSYN) 375 MG tablet Take 375 mg by mouth 2 (two) times daily as needed. As needed for fluid accumulation/inflammation on knee   Past Month    Patient Stressors: Other: school, finances    Patient Strengths: Ability for insight  Average or above average intelligence  Capable of independent living  Electronics engineer  Motivation for treatment/growth  Physical Health  Supportive family/friends  Work skills   Treatment Modalities: Medication Management, Group therapy, Case management,  1 to 1 session with clinician, Psychoeducation, Recreational therapy.   Physician Treatment Plan for Primary Diagnosis: MDD (major depressive disorder), recurrent severe, without psychosis ([place]) Long  Term Goal(s):     Short Term Goals:    Medication Management: Evaluate patient's response, side effects, and tolerance of medication regimen.  Therapeutic Interventions: 1 to 1 sessions, Unit Group sessions and Medication administration.  Evaluation of Outcomes: Not Met  Physician Treatment Plan for Secondary Diagnosis: Principal Problem:   MDD (major depressive disorder), recurrent severe, without psychosis ([place])  Long Term Goal(s):     Short Term Goals:       Medication Management: Evaluate patient's response, side effects, and tolerance of medication regimen.  Therapeutic Interventions: 1 to 1 sessions, Unit Group sessions and Medication administration.  Evaluation of Outcomes: Not Met   RN Treatment Plan for Primary Diagnosis: MDD (major depressive disorder), recurrent severe, without psychosis ([place]) Long Term Goal(s): Knowledge of disease and therapeutic regimen to maintain health will improve  Short Term Goals: Ability to remain free from injury will improve, Ability to verbalize frustration and anger appropriately will improve, Ability to demonstrate self-control, Ability to participate in decision making will improve, Ability to identify and develop effective coping behaviors will improve, and Compliance with prescribed medications will improve  Medication Management: RN will administer medications as ordered by provider, will assess and evaluate patient's response and provide education to patient for prescribed medication. RN will report any adverse and/or side effects to prescribing provider.  Therapeutic Interventions: 1 on 1 counseling sessions, Psychoeducation, Medication administration, Evaluate responses to treatment, Monitor vital signs and CBGs as ordered, Perform/monitor CIWA, COWS, AIMS and Fall Risk screenings as ordered, Perform wound care treatments as ordered.  Evaluation of Outcomes: Not  Met   LCSW Treatment Plan for Primary Diagnosis: MDD (major depressive  disorder), recurrent severe, without psychosis ([place]) Long Term Goal(s): Safe transition to appropriate next level of care at discharge, Engage patient in therapeutic group addressing interpersonal concerns.  Short Term Goals: Engage patient in aftercare planning with referrals and resources, Increase social support, Increase ability to appropriately verbalize feelings, Increase emotional regulation, Identify triggers associated with mental health/substance abuse issues, and Increase skills for wellness and recovery  Therapeutic Interventions: Assess for all discharge needs, 1 to 1 time with Social worker, Explore available resources and support systems, Assess for adequacy in community support network, Educate family and significant other(s) on suicide prevention, Complete Psychosocial Assessment, Interpersonal group therapy.  Evaluation of Outcomes: Not Met   Progress in Treatment: Attending groups: No. Participating in groups: No. Taking medication as prescribed: Yes. Toleration medication: Yes. Family/Significant other contact made: No, will contact:  if consent is provided Patient understands diagnosis: Yes. Discussing patient identified problems/goals with staff: Yes. Medical problems stabilized or resolved: Yes. Denies suicidal/homicidal ideation: Yes. Issues/concerns per patient self-inventory: No.   New problem(s) identified: No, Describe:  none  New Short Term/Long Term Goal(s): medication stabilization, elimination of SI thoughts, development of comprehensive mental wellness plan.    Patient Goals:  Did not attend  Discharge Plan or Barriers: Patient recently admitted. CSW will continue to follow and assess for appropriate referrals and possible discharge planning.    Reason for Continuation of Hospitalization: Depression Medication stabilization Suicidal ideation  Estimated Length of Stay: 3-5 days   Scribe for Treatment Team: Vassie Moselle,  LCSW 03/09/2021 10:29 AM

## 2021-03-09 NOTE — Progress Notes (Signed)
D:  Patient's self inventory sheet, patient sleeps good, no sleep medication.  Good appetite, normal energy level, good concentration.  Denied depression, hopeless and anxiety.  Denied withdrawals.  Denied withdrawals.  Denied SI.  Denied physical problems.  Denied physical pain.  Goal is "I'm fine."  Plans to stay focused.  Discharge date?  Does have discharge plans. A:  Medications administered per MD orders.  Emotional support and encouragement given patient. R:  Denied SI and HI, contracts for safety.  Denied A/V hallucinations.  Safety maintained with 15 minute checks.

## 2021-03-09 NOTE — H&P (Signed)
Psychiatric Admission Assessment Adult  Patient Identification: Daniel Higgins MRN:  [id number] Date of Evaluation:  03/09/2021 Chief Complaint:  MDD (major depressive disorder), recurrent severe, without psychosis ([place]) [F33.2] Principal Diagnosis: Elevated ETOH level Diagnosis:  Principal Problem:   Elevated ETOH level  History of Present Illness: Daniel Higgins is a 26 year old male with no previous psychiatric history who initially presented to [facility] endorsing nausea and vomiting and was later transferred to [facility] for reported SI under IVC.  On assessment today patient is AO x4.  Patient reports that he is not currently nor has he been suicidal.  Patient reports that his family came from [place] for the holidays and relieving over the weekend.  Patient reports that prior to them leaving he decided to drink alcohol for the first time.  Patient reports that he drank a cup of wine while the rest of his cousins drink liquor.  Patient reports "I did not know that I need to have food on my stomach when I drink alcohol."  Patient reports that he unfortunately began having significant nausea, vomiting and headache once he was intoxicated.  Patient reports that he told his mother's girlfriend that he was not feeling well and she gave him some Tylenol.  Patient reports that his cousins left that same night and asked him if he was feeling better.  Patient reports that he took a picture of 3 packets of Tylenol that he had received and told them that he had received Tylenol to help with his pain.  Patient reports that he also texted his cousins that he had enjoyed spending time with them and wished that they would come back and visit him another time.  Patient reports that he only took one of the 3 packets of Tylenol and reports there were 2 pills inside.  Patient reports unfortunately he continued to have headache and was vomiting and his family called EMS due to his symptoms.  Patient reports that he is not sure  why he was asked to speak to behavioral health provider while in the ED but he recalls speaking to someone virtually.  Patient reports that he explained that he was afraid that he would be missing his job.  Patient reports that the provider endorsed understanding but explained that he needed to see a physician prior to being discharged.  Patient reports "I made the poor decision to go ahead and leave without being discharge."  Patient reports that when police arrived at his home he tried to hide in his closet until he learned that he had been IV seed and had to return to the hospital.  Patient reports that at no point was he ever suicidal and denies any history of previous self-injurious behavior.  Patient currently denies SI, HI and AVH.  Patient reports that he has been sleeping very well over the past few weeks, denies any symptoms of anhedonia, hopelessness, worthlessness, guilt, change in energy level, change in concentration or change in his appetite.  Patient denies any history of symptoms concerning for manic or hypomanic behavior.  Patient denies feeling overly anxious or nervous.  Patient denies any history of frequent worrying or symptoms of social anxiety.  Patient denies any history of panic attacks.  Patient denies any history of physical, verbal or sexual abuse.  Patient endorses that his own mother did die 09/2020 but he feels that he has handled her death well and reports "my mom wants me to be happy and make her proud."  Patient denies any feelings of thought insertion, thought deletion, thought broadcasting or feeling that he is receiving special messages or paranoid.  Patient provides permission to speak with his girlfriend for collateral.  Patient reports that he has known his girlfriend's mother for approximately 6 years and calls her "mom."  Collateral, GF: GF denies concern that patient was trying to kill himself, she reports they called EMS because patient passed out from  nausea, EtOH, vomiting.  GF reports that he has never voiced any SI to her and she is not concerned about his mental health or wellbeing. Patient reports that she believed he is in the hospital because he had an IVC and left without being discharged. Associated Signs/Symptoms: Depression Symptoms:   Denies Duration of Depression Symptoms: Less than two weeks  (Hypo) Manic Symptoms:   Denies Anxiety Symptoms:   Denies Psychotic Symptoms:   Denies PTSD Symptoms: NA Total Time spent with patient: 30 minutes  Past Psychiatric History: None  Is the patient at risk to self? No.  Has the patient been a risk to self in the past 6 months? No.  Has the patient been a risk to self within the distant past? No.  Is the patient a risk to others? No.  Has the patient been a risk to others in the past 6 months? No.  Has the patient been a risk to others within the distant past? No.   Prior Inpatient Therapy:   Prior Outpatient Therapy:    Alcohol Screening: 1. How often do you have a drink containing alcohol?: Monthly or less 2. How many drinks containing alcohol do you have on a typical day when you are drinking?: 1 or 2 3. How often do you have six or more drinks on one occasion?: Never AUDIT-C Score: 1 4. How often during the last year have you found that you were not able to stop drinking once you had started?: Never 5. How often during the last year have you failed to do what was normally expected from you because of drinking?: Never 6. How often during the last year have you needed a first drink in the morning to get yourself going after a heavy drinking session?: Never 7. How often during the last year have you had a feeling of guilt of remorse after drinking?: Never 8. How often during the last year have you been unable to remember what happened the night before because you had been drinking?: Never 9. Have you or someone else been injured as a result of your drinking?: No 10. Has a  relative or friend or a doctor or another health worker been concerned about your drinking or suggested you cut down?: No Alcohol Use Disorder Identification Test Final Score (AUDIT): 1 Substance Abuse History in the last 12 months:  No. Consequences of Substance Abuse: NA Previous Psychotropic Medications: No  Psychological Evaluations: No  Past Medical History:  Past Medical History:  Diagnosis Date   Lung collapse    STI (sexually transmitted infection) 05/16/2018    Past Surgical History:  Procedure Laterality Date   CHEST TUBE INSERTION     Family History:  Family History  Problem Relation Age of Onset   Healthy Father    Family Psychiatric  History: Denies Tobacco Screening: Denies Social History:  Social History   Substance and Sexual Activity  Alcohol Use No     Social History   Substance and Sexual Activity  Drug Use No    Additional Social History:  Specify valuables returned: clothing, toiletries, misc items   -Lives with his girlfriend and their 2 children ages 71 and 43, patient also has a 64-year-old - Works at [employer], endorses leaving his job - 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   Blood Alcohol level:  Lab Results  Component Value Date   ETH 16 (H) [id number]    Metabolic Disorder Labs:  No results found for: HGBA1C, MPG No results found for: PROLACTIN No results found for: CHOL, TRIG, HDL, CHOLHDL, VLDL, LDLCALC  Current Medications: Current Facility-Administered Medications  Medication Dose Route Frequency Provider Last Rate Last Admin   acetaminophen (TYLENOL) tablet 650 mg   650 mg Oral Q6H PRN Ajibola, Ene A, NP       alum & mag hydroxide-simeth (MAALOX/MYLANTA) 200-200-20 MG/5ML suspension 30 mL  30 mL Oral Q4H PRN Ajibola, Ene A, NP       hydrOXYzine (ATARAX) tablet 25 mg  25 mg Oral TID PRN Ajibola, Ene A, NP       magnesium hydroxide (MILK OF MAGNESIA) suspension 30 mL  30 mL Oral Daily PRN Ajibola, Ene A, NP       traZODone (DESYREL) tablet 50 mg  50 mg Oral QHS PRN Ajibola, Ene A, NP       PTA Medications: Medications Prior to Admission  Medication Sig Dispense Refill Last Dose   aspirin-acetaminophen-caffeine (EXCEDRIN MIGRAINE) 250-250-65 MG tablet Take 2 tablets by mouth every 6 (six) hours as needed for headache.   03/07/2021   naproxen (NAPROSYN) 375 MG tablet Take 375 mg by mouth 2 (two) times daily as needed. As needed for fluid accumulation/inflammation on knee   Past Month    Musculoskeletal: Strength & Muscle Tone: within normal limits Gait & Station: normal Patient leans: N/A            Psychiatric Specialty Exam:  Presentation  General Appearance: Appropriate for Environment; Casual  Eye Contact:Good  Speech:Clear and Coherent  Speech Volume:Normal  Handedness:No data recorded  Mood and Affect  Mood:Euthymic  Affect:Appropriate; Congruent   Thought Process  Thought Processes:Coherent  Duration of Psychotic Symptoms: No data recorded Past Diagnosis of Schizophrenia or Psychoactive disorder: No  Descriptions of Associations:Intact  Orientation:Full (Time, Place and Person)  Thought Content:Logical  Hallucinations:Hallucinations: None  Ideas of Reference:None  Suicidal Thoughts:Suicidal Thoughts: No  Homicidal Thoughts:Homicidal Thoughts: No   Sensorium  Memory:Immediate Good; Recent Good; Remote Good  Judgment:Fair  Insight:Fair   Executive Functions  Concentration:Good  Attention Span:Good  [place] of Knowledge:Good  Language:Good   Psychomotor Activity  Psychomotor  Activity:Psychomotor Activity: Normal   Assets  Assets:Communication Skills; Resilience; Social Support; Talents/Skills; Housing   Sleep  Sleep:Sleep: Fair    Physical Exam: Physical Exam Constitutional:      Appearance: Normal appearance.  HENT:     Head: Normocephalic and atraumatic.  Pulmonary:     Effort: Pulmonary effort is normal.  Neurological:     Mental Status: He is alert and oriented to person, place, and time.   Review of Systems  Cardiovascular:  Negative for chest pain.  Gastrointestinal:  Negative for nausea and vomiting.  Psychiatric/Behavioral:  Negative for depression, hallucinations and suicidal ideas. The patient does not have insomnia.   Blood pressure (!) 84/62, pulse 83, temperature 97.8 F (36.6 C), resp. rate 16, height 5\' 11"  (1.803 m), weight 55.5 kg, SpO2 100 %. Body mass index is 17.07 kg/m.  Treatment Plan Summary: Daily contact with patient to assess and evaluate symptoms and progress in treatment and Medication management  Jameek Devi is a 26 year old with no previous psychiatric history who was transferred to [facility] for reported SI.  Does not appear based on patient's initial assessment and collateral the patient never endorsed true SI.  There was likely miscommunication and patient was also endorsing that he had been intoxicated during his time in the emergency department.  Patient's story is further validated by negative acetaminophen and salicylate level and no history of reported concern from his girlfriend with whom he lives.  Furthermore patient previously endorsed that his godmother who he also calls "mom" is deaf and patient has been learning ASL, indicating that it would have been difficult to get collateral from her.  TTS also recorded in patient's EMR that he did not meet criteria.  Patient was educated on staying away from alcohol and agreed that he would not try this substance again.  Patient does not appear to meet criteria for IVC and  was willing to sign himself in voluntarily for continued observation over the next 24 hours.  Labs reviewed: BMP-WNL, Mg-WNL, acetaminophen level-WNL, salicylate level-WNL, troponin-WNL, UA-protein 100 and otherwise normal, CBC-WNL, UDS-(negative), EtOH-16    EtOH intoxication - Rescind IVC  Safety: Every 15 minute checks, routine   Physician Treatment Plan for Primary Diagnosis: Elevated ETOH level Long Term Goal(s): Improvement in symptoms so as ready for discharge  Short Term Goals: Ability to identify triggers associated with substance abuse/mental health issues will improve  Physician Treatment Plan for Secondary Diagnosis: Principal Problem:   Elevated ETOH level  Long Term Goal(s): Improvement in symptoms so as ready for discharge  Short Term Goals: Ability to identify triggers associated with substance abuse/mental health issues will improve  I certify that inpatient services furnished can reasonably be expected to improve the patient's condition.   PGY-2 Freida Busman, MD 1/9/20235:29 PM

## 2021-03-09 NOTE — BHH Counselor (Signed)
Adult Comprehensive Assessment  Patient ID: KONNER BILSKI, male   DOB: [birth year], 26 y.o.   MRN: [id number]  Information Source:    Current Stressors:     Living/Environment/Situation:  Living Arrangements: Spouse/significant other  Family History:     Childhood History:     Education:     Employment/Work Situation:      Museum/gallery curator Resources:      Alcohol/Substance Abuse:      Social Support System:      Leisure/Recreation:      Strengths/Needs:      Discharge Plan:      Summary/Recommendations:   Summary and Recommendations (to be completed by the evaluator): Kaileb Perdomo was admitted due to bizarre behavior. Pt was admitted and discharged in a short period of time so a chart review summary was completed. Pt has no hx of mental health. Recent Stressors include his mother's death. Pt currently sees no outpatient providers. While here, Jamar Dierkes can benefit from crisis stabilization, medication management, therapeutic milieu, and referrals for services.  Mliss Fritz. 03/09/2021

## 2021-03-09 NOTE — BHH Suicide Risk Assessment (Signed)
[facility] INPATIENT:  Family/Significant Other Suicide Prevention Education  Suicide Prevention Education:  Education Completed; Maree Erie [phone] (Girlfriend) has been identified by the patient as the family member/significant other with whom the patient will be residing, and identified as the person(s) who will aid the patient in the event of a mental health crisis (suicidal ideations/suicide attempt).  With written consent from the patient, the family member/significant other has been provided the following suicide prevention education, prior to the and/or following the discharge of the patient.  The suicide prevention education provided includes the following: Suicide risk factors Suicide prevention and interventions National Suicide Hotline telephone number [facility] assessment telephone number [facility] Emergency Assistance 911 [facility] and/or Residential Mobile Crisis Unit telephone number  Request made of family/significant other to: Remove weapons (e.g., guns, rifles, knives), all items previously/currently identified as safety concern.   Remove drugs/medications (over-the-counter, prescriptions, illicit drugs), all items previously/currently identified as a safety concern.  The family member/significant other verbalizes understanding of the suicide prevention education information provided.  The family member/significant other agrees to remove the items of safety concern listed above.  CSW spoke with Ms. Clements who confirms that the Pt does live in the home with her and that he can return there at discharge.  She states that there are no weapons or firearms in the home and that she will be the one picking him up at discharge.  She states that she has no questions or concerns at this time.  CSW completed SPE with Ms. Clements.  Metro Kung Gwen Sarvis 03/09/2021, 3:56 PM

## 2021-03-10 DIAGNOSIS — R780 Finding of alcohol in blood: Secondary | ICD-10-CM

## 2021-03-10 NOTE — BHH Suicide Risk Assessment (Signed)
[facility] Discharge Suicide Risk Assessment   Principal Problem: Elevated ETOH level Discharge Diagnoses: Principal Problem:   Elevated ETOH level   Total Time spent with patient: 15 minutes  Daniel Higgins is a 26 year old male with no previous psychiatric history who initially presented to [facility] endorsing nausea and vomiting and was later transferred to [facility] for reported SI under IVC.  After intake interview, review of pre-admit notes, and resident physician discussion with the patient's girl friend, it was determined that the patient should not be under IVC, and the IVC was rescinded, and the pt signed in as voluntary.  After interview and obtaining collateral, the pt does not pose significant risk of harm to himself or others. No axis 1 psychiatric diagnosis was provided and no medications were started.   The patient denied having suicidal thoughts more than 24 hours prior to discharge.  Patient denies having homicidal thoughts.  Patient denies having auditory hallucinations.  Patient denies any visual hallucinations.  Patient denies having paranoid thoughts.  The patient is able to verbalize their individual safety plan to this provider.  Discussed with the patient, the impact of alcohol, drugs, tobacco have been there overall psychiatric and medical wellbeing, and total abstinence from substance use was recommended the patient.     Musculoskeletal: Strength & Muscle Tone: within normal limits Gait & Station: normal Patient leans: N/A  Psychiatric Specialty Exam  Presentation  General Appearance: Appropriate for Environment; Casual; Well Groomed  Eye Contact:Good  Speech:Clear and Coherent; Normal Rate  Speech Volume:Normal  Handedness:No data recorded  Mood and Affect  Mood:Euthymic  Duration of Depression Symptoms: Less than two weeks  Affect:Appropriate; Congruent; Full Range   Thought Process  Thought Processes:Coherent; Linear  Descriptions of  Associations:Intact  Orientation:Full (Time, Place and Person)  Thought Content:Logical  History of Schizophrenia/Schizoaffective disorder:No  Duration of Psychotic Symptoms:No data recorded Hallucinations:Hallucinations: None  Ideas of Reference:None  Suicidal Thoughts:Suicidal Thoughts: No  Homicidal Thoughts:Homicidal Thoughts: No   Sensorium  Memory:Immediate Good; Recent Good; Remote Good  Judgment:Good  Insight:Good   Executive Functions  Concentration:Good  Attention Span:Good  Recall:Good  Fund of Knowledge:Good  Language:Good   Psychomotor Activity  Psychomotor Activity:Psychomotor Activity: Normal   Assets  Assets:Communication Skills; Social Support; Talents/Skills; Vocational/Educational; Housing   Sleep  Sleep:Sleep: Good   Physical Exam: Physical Exam Vitals reviewed.  Constitutional:      General: He is not in acute distress.    Appearance: He is normal weight. He is not toxic-appearing.  Pulmonary:     Effort: Pulmonary effort is normal.  Neurological:     General: No focal deficit present.     Mental Status: He is alert.     Motor: No weakness.     Gait: Gait normal.  Psychiatric:        Mood and Affect: Mood normal.        Behavior: Behavior normal.        Thought Content: Thought content normal.        Judgment: Judgment normal.   Review of Systems  Constitutional:  Negative for chills and fever.  Cardiovascular:  Negative for chest pain and palpitations.  Neurological:  Negative for dizziness, tingling, tremors and headaches.  Psychiatric/Behavioral:  Negative for depression, hallucinations, memory loss, substance abuse and suicidal ideas. The patient is not nervous/anxious and does not have insomnia.   All other systems reviewed and are negative.  Blood pressure 92/70, pulse 87, temperature 97.8 F (36.6 C), resp. rate 19, height 5'  11" (1.803 m), weight 55.5 kg, SpO2 98 %. Body mass index is 17.07 kg/m.  Mental  Status Per Nursing Assessment::   On Admission:  NA  Demographic factors:  Male, Adolescent or young adult Loss Factors:  Loss of significant relationship Historical Factors:  NA Risk Reduction Factors:  Responsible for children under 26 years of age, Sense of responsibility to family, Employed, Living with another person, especially a relative, Positive social support, Positive therapeutic relationship  Continued Clinical Symptoms:  none  Cognitive Features That Contribute To Risk:  None    Suicide Risk:  Minimal: No identifiable suicidal ideation.  Patients presenting with no risk factors but with morbid ruminations; may be classified as minimal risk based on the severity of the depressive symptoms   Follow-up Information     [facility] Follow up.   Specialty: Behavioral Health Why: You may go to this provider for therapy and medication management services during walk in hours:  Monday through Wednesday from 7:45 am to 11:00 am.  Services are provided on a first come, first served basis. Contact information: [address] [place] [address] [phone]                Plan Of Care/Follow-up recommendations:   Activity: as tolerated  Diet: heart healthy  Other:  -Follow-up with outpatient primary care doctor and other specialists -for management of chronic medical disease, including: preventative medicine and screenings   -Testing: Follow-up with outpatient provider for abnormal lab results: none  -Recommend abstinence from alcohol, tobacco, and other illicit drug use at discharge.   -If your psychiatric symptoms recur or worsen, call 911, [phone] or go to the nearest emergency department.  -If suicidal thoughts occur, call your outpatient psychiatric provider, 911, [phone] or go to the nearest emergency department.   Cristy Hilts, MD 03/10/2021, 10:03 AM

## 2021-03-10 NOTE — Progress Notes (Signed)
Discharge note:  RN met with pt and reviewed pt's discharge instructions. Pt verbalized understanding of discharge instructions and pt did not have any questions. RN reviewed and provided pt with a copy of SRA, AVS and Transition Record. RN returned pt's belongings to pt. Pt denied SI/HI/AVH and voiced no concerns. Pt was ready to go home. Pt stated he has basketball practice today and is excited to play. Pt was appreciative of the care pt received at [facility]. Patient discharged to the lobby without incident.   03/10/21 0900  Psych Admission Type (Psych Patients Only)  Admission Status Voluntary  Psychosocial Assessment  Patient Complaints None  Eye Contact Fair  Facial Expression Flat  Affect Appropriate to circumstance  Speech Logical/coherent  Interaction Assertive  Motor Activity Other (Comment) (WDL)  Appearance/Hygiene Unremarkable  Behavior Characteristics Cooperative;Appropriate to situation;Calm  Mood Pleasant  Aggressive Behavior  Effect No apparent injury  Thought Process  Coherency WDL  Content WDL  Delusions None reported or observed  Perception WDL  Hallucination None reported or observed  Judgment Impaired  Confusion None  Danger to Self  Current suicidal ideation? Denies  Danger to Others  Danger to Others None reported or observed

## 2021-03-10 NOTE — BHH Group Notes (Signed)
B[facility] Group Notes:  (Nursing/MHT/Case Management/Adjunct)  Date:  03/10/2021  Time:  10:12 AM  Type of Therapy:  Group Therapy  Participation Level:  Active  Participation Quality:  Appropriate  Affect:  Appropriate  Cognitive:  Appropriate  Insight:  Appropriate  Engagement in Group:  Engaged  Modes of Intervention:  Discussion  Summary of Progress/Problems: Patient expressed his hopefulness of discharge today and what he needed to do to have a  better sense of wellbeing.  Reymundo Poll 03/10/2021, 10:12 AM

## 2021-03-10 NOTE — Discharge Summary (Addendum)
Physician Discharge Summary Note  Patient:  Daniel Higgins is an 26 y.o., male MRN:  [id number] DOB:  08/25/1995 Patient phone:  [phone] (home)  Patient address:   [address] [place] [id number],  Total Time spent with patient: 15 minutes  Date of Admission:  03/08/2021 Date of Discharge: 03/10/2021  Reason for Admission: Daniel NeuShamar Higgins is a 26 year old male with no previous psychiatric history who [facility] presented [facility] endorsing nausea and vomiting and [facility] reported SI under [facility].   Principal Problem: Elevated ETOH level Discharge Diagnoses: Principal Problem:   Elevated ETOH level   Past Psychiatric History: pt denies previous psych diagnoses, hospitalization, suicide attempt, and denies having taken any psychiatric medications in the past.   Past Medical History:  Past Medical History:  Diagnosis Date   Lung collapse    STI (sexually transmitted infection) 05/16/2018    Past Surgical History:  Procedure Laterality Date   CHEST TUBE INSERTION     Family History:  Family History  Problem Relation Age of Onset   Healthy Father    Family Psychiatric  History: None Social History:  Social History   Substance and Sexual Activity  Alcohol Use No     Social History   Substance and Sexual Activity  Drug Use No    Social History   Socioeconomic History   Marital status: Single    Spouse name: Not on file   Number of children: Not on file   Years of education: Not on file   Highest education level: Not on file  Occupational History   Not on file  Tobacco Use   Smoking status: Some Days    Types: Cigars   Smokeless tobacco: Never  Vaping Use   Vaping Use: Never used  Substance and Sexual Activity   Alcohol use: No   Drug use: No   Sexual activity: Yes    Birth control/protection: None  Other Topics Concern   Not on file  Social History Narrative   Not on file   Social Determinants of Health    Financial Resource Strain: Not on file  Food Insecurity: Not on file  Transportation Needs: Not on file  Physical Activity: Not on file  Stress: Not on file  Social Connections: Not on file    Hospital Course:  Daniel Higgins is a 26 year old male with no previous psychiatric history who initially presented [facility]ing [facility] and vomiting and [facility] SI under [facility]. There was no evidence on assessment that patient had ever endorsed SI and collateral confirmed that patient had not been taken to the hospital for SI or drug OD. Collateral did confirm patient's story of headache, nausea, and [facility]. Patient endorsed not having a hx of EtoH use and not being aware of drinking etiquette. Patient's labs confirmed that he had a low level of EtoH in his system but his UDS, Salicylate and Acetaminophen level were negative. Patient agreed to sign himself in voluntarily; therefore his IVC was rescinded. Patient stayed for 24 hour observation were he was noted to attempt to participate on the unit and encourage others. At no time was there concern for mood instability or psychotic behavior. Collateral from patient's GF also denied any hx of concern for this in patient.  Patient was educated on community resources should he or any one he knows need mental health services in the  future. Patient also endorsed that he would not drink EtoH in the future. Patient denied SI, HI, and AVH at time of discharge. Patient did not require medication intervention or follow-ups.   Physical Findings: AIMS: n/a CIWA:    n/a COWS:    n/a  Musculoskeletal: Strength & Muscle Tone: within normal limits Gait & Station: normal Patient leans: N/A   Psychiatric Specialty Exam:  Presentation  General Appearance: Appropriate for Environment; Casual; Well Groomed  Eye Contact:Good  Speech:Clear and Coherent; Normal Rate  Speech Volume:Normal  Handedness:No  data recorded  Mood and Affect  Mood:Euthymic  Affect:Appropriate; Congruent; Full Range   Thought Process  Thought Processes:Coherent; Linear  Descriptions of Associations:Intact  Orientation:Full (Time, Place and Person)  Thought Content:Logical  History of Schizophrenia/Schizoaffective disorder:No  Duration of Psychotic Symptoms:No data recorded Hallucinations:Hallucinations: None  Ideas of Reference:None  Suicidal Thoughts:Suicidal Thoughts: No  Homicidal Thoughts:Homicidal Thoughts: No   Sensorium  Memory:Immediate Good; Recent Good; Remote Good  Judgment:Good  Insight:Good   Executive Functions  Concentration:Good  Attention Span:Good  Recall:Good  Fund of Knowledge:Good  Language:Good   Psychomotor Activity  Psychomotor Activity:Psychomotor Activity: Normal   Assets  Assets:Communication Skills; Social Support; Talents/Skills; Vocational/Educational; Housing   Sleep  Sleep:Sleep: Good    Physical Exam: Physical Exam Vitals reviewed.  Constitutional:      Appearance: Normal appearance.  HENT:     Head: Normocephalic and atraumatic.  Pulmonary:     Effort: Pulmonary effort is normal.  Neurological:     Mental Status: He is alert.     Motor: No weakness.     Gait: Gait normal.  Psychiatric:        Mood and Affect: Mood normal.        Behavior: Behavior normal.        Thought Content: Thought content normal.        Judgment: Judgment normal.   Review of Systems  Constitutional:  Negative for chills and fever.  Cardiovascular:  Negative for chest pain and palpitations.  Neurological:  Negative for dizziness, tingling, tremors and headaches.  Psychiatric/Behavioral:  Negative for depression, hallucinations and suicidal ideas. The patient is not nervous/anxious and does not have insomnia.   All other systems reviewed and are negative.  Blood pressure 92/70, pulse 87, temperature 97.8 F (36.6 C), resp. rate 19, height 5\' 11"   (1.803 m), weight 55.5 kg, SpO2 98 %. Body mass index is 17.07 kg/m.   Social History   Tobacco Use  Smoking Status Some Days   Types: Cigars  Smokeless Tobacco Never   Tobacco Cessation:  N/A, patient does not currently use tobacco products   Blood Alcohol level:  Lab Results  Component Value Date   ETH 16 (H) 03/07/2021    Metabolic Disorder Labs:  No results found for: HGBA1C, MPG No results found for: PROLACTIN No results found for: CHOL, TRIG, HDL, CHOLHDL, VLDL, LDLCALC  See Psychiatric Specialty Exam and Suicide Risk Assessment completed by Attending Physician prior to discharge.  Discharge destination:  Home  Is patient on multiple antipsychotic therapies at discharge:  No   Has Patient had three or more failed trials of antipsychotic monotherapy by history:  No  Recommended Plan for Multiple Antipsychotic Therapies: NA   Allergies as of 03/10/2021       Reactions   Other Other (See Comments)   Seasonal Allergies- Sneezing, Runny nose        Medication List     STOP taking these medications  aspirin-acetaminophen-caffeine 250-250-65 MG tablet Commonly known as: EXCEDRIN MIGRAINE   naproxen 375 MG tablet Commonly known as: NAPROSYN        Follow-up Information     [facility] Follow up.   Specialty: Behavioral Health Why: You may go to this provider for therapy and medication management services during walk in hours:  Monday through Wednesday from 7:45 am to 11:00 am.  Services are provided on a first come, first served basis. Contact information: [address] [place] [address] [phone]                Follow-up recommendations:   Activity: as tolerated   Diet: heart healthy   Other:   -Follow-up with outpatient primary care doctor and other specialists -for management of chronic medical disease, including: preventative medicine and screenings    -Testing: Follow-up with  outpatient provider for abnormal lab results: none   -Recommend abstinence from alcohol, tobacco, and other illicit drug use at discharge.    -If your psychiatric symptoms recur or worsen, call 911, [phone] or go to the nearest emergency department.   -If suicidal thoughts occur, call your outpatient psychiatric provider, 911, [phone] or go to the nearest emergency department.  Signed: Bobbye Morton, MD 03/10/2021, 10:51 AM    Total Time Spent in Direct Patient Care:  I personally spent 45 minutes on the unit in direct patient care. The direct patient care time included face-to-face time with the patient, reviewing the patient's chart, communicating with other professionals, and coordinating care. Greater than 50% of this time was spent in counseling or coordinating care with the patient regarding goals of hospitalization, psycho-education, and discharge planning needs.  On my assessment the patient denied SI, HI, AVH, paranoia, ideas of reference, or first rank symptoms on day of discharge. Patient denied drug cravings or active signs of withdrawal. Patient denied medication side-effects. Patient was not deemed to be a danger to self or others on day of discharge and was in agreement with discharge plans.   I have independently evaluated the patient during a face-to-face assessment on the day of discharge. I reviewed the patient's chart, and I participated in key portions of the service. I discussed the case with the resident physician, and I agree with the assessment and plan of care as documented in the resident physician's note, as addended by me or notated below:  I directly edited the discharge summary, as above.   Phineas Inches, MD Psychiatrist

## 2021-03-10 NOTE — Progress Notes (Signed)
°  [facility] Adult Case Management Discharge Plan :  Will you be returning to the same living situation after discharge:  Yes,  personal home At discharge, do you have transportation home?: Yes,  girlfriend Do you have the ability to pay for your medications: No.  Release of information consent forms completed and in the chart;  Patient's signature needed at discharge.  Patient to Follow up at:  Follow-up Information     [facility] Follow up.   Specialty: Behavioral Health Why: You may go to this provider for therapy and medication management services during walk in hours:  Monday through Wednesday from 7:45 am to 11:00 am.  Services are provided on a first come, first served basis. Contact information: [address] [place] [address] [phone]                Next level of care provider has access to [facility] Link:yes  Safety Planning and Suicide Prevention discussed: Yes,  girlfriend     Has patient been referred to the Quitline?: Patient refused referral  Patient has been referred for addiction treatment: N/A  Felizardo Hoffmann, LCSWA 03/10/2021, 10:10 AM

## 2021-03-10 NOTE — BHH Suicide Risk Assessment (Cosign Needed)
Suicide Risk Assessment  Discharge Assessment    [facility] Discharge Suicide Risk Assessment   Principal Problem: Elevated ETOH level Discharge Diagnoses: Principal Problem:   Elevated ETOH level  Daniel Higgins is a 26 year old male with no previous psychiatric history who initially presented [facility] endorsing nausea and vomiting and was later transferred to [facility] for reported SI under IVC.Patient was able to sign himself in voluntary to [facility].   Patient did very well while on the unit. Patient attended group therapy and attempted to up lift other patients. Patient endorsed good insight and judgement trying to learn from the experiences of others. Patient endorsed that he has no intention of drinking EtoH again. Patient consistently denied SI, HI, and AVH throughout his admission and on day of discharge. Patient did not require any medication but endorsed eating, sleeping well.    Total Time spent with patient: 15 minutes  Musculoskeletal: Strength & Muscle Tone: within normal limits Gait & Station: normal Patient leans: N/A  Psychiatric Specialty Exam  Presentation  General Appearance: Appropriate for Environment; Casual  Eye Contact:Good  Speech:Clear and Coherent  Speech Volume:Normal  Handedness:No data recorded  Mood and Affect  Mood:Euthymic  Duration of Depression Symptoms: Less than two weeks  Affect:Appropriate; Congruent   Thought Process  Thought Processes:Coherent  Descriptions of Associations:Intact  Orientation:Full (Time, Place and Person)  Thought Content:Logical  History of Schizophrenia/Schizoaffective disorder:No  Duration of Psychotic Symptoms:No data recorded Hallucinations:Hallucinations: None  Ideas of Reference:None  Suicidal Thoughts:Suicidal Thoughts: No  Homicidal Thoughts:Homicidal Thoughts: No   Sensorium  Memory:Immediate Good; Recent Good; Remote Good  Judgment:Fair  Insight:Fair   Executive Functions   Concentration:Good  Attention Span:Good  Recall:Good  Fund of Knowledge:Good  Language:Good   Psychomotor Activity  Psychomotor Activity:Psychomotor Activity: Normal   Assets  Assets:Communication Skills; Resilience; Social Support; Talents/Skills; Housing   Sleep  Sleep:Sleep: Fair   Physical Exam: Physical Exam ROS Blood pressure 92/70, pulse 87, temperature 97.8 F (36.6 C), resp. rate 19, height 5\' 11"  (1.803 m), weight 55.5 kg, SpO2 98 %. Body mass index is 17.07 kg/m.  Mental Status Per Nursing Assessment::   On Admission:  NA  Demographic Factors:  Male, Adolescent or young adult, and Low socioeconomic status  Loss Factors: NA  Historical Factors: NA  Risk Reduction Factors:   Religious beliefs about death, Living with another person, especially a relative, Positive social support, and Positive coping skills or problem solving skills  Continued Clinical Symptoms:  NA  Cognitive Features That Contribute To Risk:  None    Suicide Risk:  Minimal: No identifiable suicidal ideation.  Patients presenting with no risk factors but with morbid ruminations; may be classified as minimal risk based on the severity of the depressive symptoms   Follow-up Information     [facility] Follow up.   Specialty: Behavioral Health Why: You may go to this provider for therapy and medication management services during walk in hours:  Monday through Wednesday from 7:45 am to 11:00 am.  Services are provided on a first come, first served basis. Contact information: [address] [place] [phone]                Plan Of Care/Follow-up recommendations:  Follow up recommendations: - Activity as tolerated. - Diet as recommended by PCP. - Keep all scheduled follow-up appointments as recommended.    PGY-2 [phone], MD 03/10/2021, 9:50 AM

## 2021-03-13 NOTE — BHH Group Notes (Signed)
Spiritual care group on grief and loss facilitated by chaplain Katy Buster Schueller, BCC   Group Goal:   Support / Education around grief and loss   Members engage in facilitated group support and psycho-social education.   Group Description:   Following introductions and group rules, group members engaged in facilitated group dialog and support around topic of loss, with particular support around experiences of loss in their lives. Group Identified types of loss (relationships / self / things) and identified patterns, circumstances, and changes that precipitate losses. Reflected on thoughts / feelings around loss, normalized grief responses, and recognized variety in grief experience. Group noted Worden's four tasks of grief in discussion.   Group drew on Adlerian / Rogerian, narrative, MI,   Patient Progress: Did not attend.  

## 2021-05-13 ENCOUNTER — Emergency Department (HOSPITAL_COMMUNITY)
Admission: EM | Admit: 2021-05-13 | Discharge: 2021-05-13 | Discharge status: Absent without leave | Payer: Self-pay | Attending: Emergency Medicine | Admitting: Emergency Medicine

## 2021-05-13 NOTE — ED Notes (Signed)
Pt not answering to be roomed .  

## 2021-05-13 NOTE — ED Notes (Signed)
Pt not answering

## 2022-09-27 ENCOUNTER — Emergency Department (HOSPITAL_COMMUNITY)
Admission: EM | Admit: 2022-09-27 | Discharge: 2022-09-27 | Discharge status: Against Medical Advice | Payer: Self-pay | Source: Home / Self Care

## 2022-10-28 ENCOUNTER — Emergency Department (HOSPITAL_COMMUNITY)
Admission: EM | Admit: 2022-10-28 | Discharge: 2022-10-29 | Discharge status: Against Medical Advice | Payer: Self-pay | Attending: Emergency Medicine | Admitting: Emergency Medicine

## 2022-10-28 ENCOUNTER — Encounter (HOSPITAL_COMMUNITY): Payer: Self-pay

## 2022-10-28 ENCOUNTER — Other Ambulatory Visit: Payer: Self-pay

## 2022-10-28 DIAGNOSIS — R369 Urethral discharge, unspecified: Secondary | ICD-10-CM | POA: Insufficient documentation

## 2022-10-28 DIAGNOSIS — Z5321 Procedure and treatment not carried out due to patient leaving prior to being seen by health care provider: Secondary | ICD-10-CM | POA: Insufficient documentation

## 2022-10-28 DIAGNOSIS — K0889 Other specified disorders of teeth and supporting structures: Secondary | ICD-10-CM | POA: Insufficient documentation

## 2022-10-28 NOTE — ED Triage Notes (Signed)
Left upper dental pain and white discharge from penis.

## 2022-10-29 ENCOUNTER — Other Ambulatory Visit: Payer: Self-pay

## 2022-10-29 ENCOUNTER — Encounter (HOSPITAL_COMMUNITY): Payer: Self-pay

## 2022-10-29 ENCOUNTER — Emergency Department (HOSPITAL_COMMUNITY)
Admission: EM | Admit: 2022-10-29 | Discharge: 2022-10-29 | Disposition: A | Discharge status: Home / Self Care | Payer: Self-pay | Attending: Emergency Medicine | Admitting: Emergency Medicine

## 2022-10-29 DIAGNOSIS — R369 Urethral discharge, unspecified: Secondary | ICD-10-CM | POA: Insufficient documentation

## 2022-10-29 DIAGNOSIS — Z202 Contact with and (suspected) exposure to infections with a predominantly sexual mode of transmission: Secondary | ICD-10-CM | POA: Insufficient documentation

## 2022-10-29 LAB — URINALYSIS, ROUTINE W REFLEX MICROSCOPIC
Bacteria, UA: NONE SEEN
Bilirubin Urine: NEGATIVE
Bilirubin Urine: NEGATIVE
Glucose, UA: NEGATIVE mg/dL
Glucose, UA: NEGATIVE mg/dL
Hgb urine dipstick: NEGATIVE
Hgb urine dipstick: NEGATIVE
Ketones, ur: NEGATIVE mg/dL
Ketones, ur: NEGATIVE mg/dL
Nitrite: NEGATIVE
Nitrite: NEGATIVE
Protein, ur: NEGATIVE mg/dL
Protein, ur: NEGATIVE mg/dL
Specific Gravity, Urine: 1.005 (ref 1.005–1.030)
Specific Gravity, Urine: 1.014 (ref 1.005–1.030)
WBC, UA: >50 WBC/hpf (ref 0–5)
WBC, UA: >50 WBC/hpf (ref 0–5)
pH: 6 (ref 5.0–8.0)
pH: 7 (ref 5.0–8.0)

## 2022-10-29 MED ORDER — CEFTRIAXONE SODIUM 1 G IJ SOLR
500.0000 mg | Freq: Once | INTRAMUSCULAR | Status: AC
  Administered 2022-10-29: 500 mg via INTRAMUSCULAR
  Filled 2022-10-29: qty 10

## 2022-10-29 MED ORDER — DOXYCYCLINE HYCLATE 100 MG PO CAPS
100.0000 mg | ORAL_CAPSULE | Freq: Two times a day (BID) | ORAL | 0 refills | Status: AC
Start: 2022-10-29 — End: 2022-11-05

## 2022-10-29 MED ORDER — LIDOCAINE HCL (PF) 1 % IJ SOLN
2.0000 mL | Freq: Once | INTRAMUSCULAR | Status: AC
  Administered 2022-10-29: 2 mL

## 2022-10-29 MED ORDER — LIDOCAINE HCL (PF) 1 % IJ SOLN: 2.0000 mL | Freq: Once | INTRAMUSCULAR | Status: DC

## 2022-10-29 MED ORDER — LIDOCAINE HCL (PF) 1 % IJ SOLN
2.0000 mL | Freq: Once | INTRAMUSCULAR | Status: DC
  Filled 2022-10-29: qty 30

## 2022-10-29 NOTE — Discharge Instructions (Addendum)
You will be contacted if your gonorrhea and/or chlamydia results are positive. However, I have prescribed an antibiotic called doxycycline to go ahead and treat for a possible infection from chlamydia.Take this antibiotic 2 times a day for the next 7 days. Take the full course of your antibiotic even if you start feeling better. Antibiotics may cause you to have diarrhea.  You have also been given a shot of ceftriaxone, another antibiotic, needed to treat gonorrhea.  Please abstain from sexual intercourse until you have completed your full course of antibiotics.  Your partner must also be treated and complete their course of antibiotics before resuming sexual intercourse.  Your urine results do show positive leukocytes which can be an indicator of a urinary tract infection.  However, we will begin with treatment of a likely STD with ceftriaxone and doxycycline.  If you continue to have burning when you pee after completion of these antibiotics, please go to be evaluated at an urgent care.   Return to the ER if you have any unexplained fevers or chills, back pain, any other new or concerning symptoms.

## 2022-10-29 NOTE — ED Notes (Signed)
Pt states that he will come back tomorrow.

## 2022-10-29 NOTE — ED Triage Notes (Signed)
Pt coming in today complaining of white discharge from penis. Pt recently had unprotected sex, endorses burning with urination.

## 2022-10-29 NOTE — ED Notes (Signed)
Elopes from treatment room saying ill be back tomorrow

## 2022-10-29 NOTE — ED Provider Notes (Signed)
[place] EMERGENCY DEPARTMENT AT [facility] Provider Note   CSN: [id number] Arrival date & time: 10/29/22  1749     History  Chief Complaint  Patient presents with   Exposure to STD    Daniel Higgins is a 27 y.o. male presents with concern for white penile discharge for the past 2 days.  He states he had unprotected sex 3 days ago with a new partner.  He then started noticing penile discharge and dysuria.  He denies any abdominal pain, back pain, nausea or vomiting, fever or chills.  Denies any new lesions. Denies any concern for syphilis or HIV and does not want testing for this.    Exposure to STD       Home Medications Prior to Admission medications   Medication Sig Start Date End Date Taking? Authorizing Provider  doxycycline (VIBRAMYCIN) 100 MG capsule Take 1 capsule (100 mg total) by mouth 2 (two) times daily for 7 days. 10/29/22 11/05/22 Yes Arabella Merles, PA-C  fluticasone (FLONASE) 50 MCG/ACT nasal spray Place 2 sprays into both nostrils daily. Patient not taking: Reported on 02/10/2019 06/14/18 12/12/19  Loren Racer, MD  loratadine (CLARITIN) 10 MG tablet Take 1 tablet (10 mg total) by mouth daily. Patient not taking: Reported on 02/10/2019 06/14/18 12/12/19  Loren Racer, MD      Allergies    Other    Review of Systems   Review of Systems  Genitourinary:  Positive for dysuria and penile discharge.    Physical Exam Updated Vital Signs BP 105/67   Pulse 60   Temp 98.6 F (37 C) (Oral)   Resp 16   Ht 5\' 11"  (1.803 m)   Wt 56.7 kg   SpO2 100%   BMI 17.43 kg/m  Physical Exam Vitals and nursing note reviewed. Exam conducted with a chaperone present.  Constitutional:      Appearance: Normal appearance.  HENT:     Head: Atraumatic.  Cardiovascular:     Rate and Rhythm: Normal rate and regular rhythm.  Pulmonary:     Effort: Pulmonary effort is normal.  Genitourinary:    Comments: Penis with white dried discharge at the urethral  meatus  No lesions or chancre  No erythema or edema of the scrotum Neurological:     General: No focal deficit present.     Mental Status: He is alert.  Psychiatric:        Mood and Affect: Mood normal.        Behavior: Behavior normal.     ED Results / Procedures / Treatments   Labs (all labs ordered are listed, but only abnormal results are displayed) Labs Reviewed  URINALYSIS, ROUTINE W REFLEX MICROSCOPIC - Abnormal; Notable for the following components:      Result Value   Color, Urine STRAW (*)    Leukocytes,Ua LARGE (*)    Bacteria, UA FEW (*)    All other components within normal limits  GC/CHLAMYDIA PROBE AMP ([place]) NOT AT [facility]  URINE CYTOLOGY ANCILLARY ONLY    EKG None  Radiology No results found.  Procedures Procedures    Medications Ordered in ED Medications  cefTRIAXone (ROCEPHIN) injection 500 mg (500 mg Intramuscular Given 10/29/22 2108)  lidocaine (PF) (XYLOCAINE) 1 % injection 2 mL (2 mLs Other Given 10/29/22 2108)    ED Course/ Medical Decision Making/ A&P  Medical Decision Making Risk Prescription drug management.   27 y.o. male presents to the ED for concern of white penile discharge for 2 days, dysuria   Differential diagnosis includes but is not limited to gonorrhea, chlamydia, UTI  ED Course:  Patient presents with concern for white penile discharge for 2 days along with dysuria.  He had unprotected sexual intercourse with a new partner.  He denies any increased frequency or hematuria.  Denies any nausea or vomiting, fever or chills.  He does not want any testing for syphilis or HIV today.  He would like to be tested for gonorrhea and chlamydia.  Upon GU examination with chaperone present, no lesions or chancre present but he does have white dried discharge at the urethral meatus.  No erythema or edema of the scrotum.  Given he has engaged in high risk sexual behavior, has suspicion for STD, and now  with new penile discharge, will treat prophylactically for gonorrhea and chlamydia.  He was given a 500 g IM injection of ceftriaxone here in the ER.  He was discharged with a 7-day course of doxycycline. I talked to the patient that he must abstain from sexual intercourse until he has completed his full course of antibiotics.  Both him and his partner must be treated.  He does have leukocytes on urinalysis and dysuria, however suspect this is due to an STD.  If he still has dysuria after completing the full course of antibiotics, he understands he needs to follow-up with a PCP or urgent care for further evaluation for possible UTI.  He verbalizes understanding.   Impression: White penile discharge Concern for gonorrhea and chlamydia  Disposition:  The patient was discharged home with instructions to complete 7-day course of doxycycline.  Follow-up with PCP or urgent care if his symptoms have not improved by the end of the antibiotics. Return precautions given.  Lab Tests: I Ordered, and personally interpreted labs.  The pertinent results include:   Urinalysis with leukocytes, no nitrites GC test ordered, results pending            Final Clinical Impression(s) / ED Diagnoses Final diagnoses:  STD exposure  Penile discharge    Rx / DC Orders ED Discharge Orders          Ordered    doxycycline (VIBRAMYCIN) 100 MG capsule  2 times daily        10/29/22 2055              Arabella Merles, PA-C 10/29/22 2250    Terrilee Files, MD 10/30/22 1057

## 2022-11-04 LAB — GC/CHLAMYDIA PROBE AMP (~~LOC~~) NOT AT ARMC
Chlamydia: NEGATIVE
Comment: NEGATIVE
Comment: NEGATIVE
Comment: NORMAL
Neisseria Gonorrhea: POSITIVE — AB
Trichomonas: NEGATIVE

## 2022-12-09 ENCOUNTER — Other Ambulatory Visit: Payer: Self-pay

## 2022-12-09 ENCOUNTER — Emergency Department (HOSPITAL_COMMUNITY)
Admission: EM | Admit: 2022-12-09 | Discharge: 2022-12-10 | Disposition: A | Discharge status: Home / Self Care | Payer: Self-pay | Attending: Emergency Medicine | Admitting: Emergency Medicine

## 2022-12-09 DIAGNOSIS — R300 Dysuria: Secondary | ICD-10-CM | POA: Insufficient documentation

## 2022-12-09 DIAGNOSIS — Z79899 Other long term (current) drug therapy: Secondary | ICD-10-CM | POA: Insufficient documentation

## 2022-12-09 DIAGNOSIS — R350 Frequency of micturition: Secondary | ICD-10-CM | POA: Insufficient documentation

## 2022-12-09 DIAGNOSIS — Z202 Contact with and (suspected) exposure to infections with a predominantly sexual mode of transmission: Secondary | ICD-10-CM | POA: Insufficient documentation

## 2022-12-09 DIAGNOSIS — R1030 Lower abdominal pain, unspecified: Secondary | ICD-10-CM | POA: Insufficient documentation

## 2022-12-09 DIAGNOSIS — R3915 Urgency of urination: Secondary | ICD-10-CM | POA: Insufficient documentation

## 2022-12-09 NOTE — ED Triage Notes (Signed)
Pt report concern for white penile discharge x 3 days with urinary burning. No pain. Concern for Gonorrhea exposure.

## 2022-12-10 LAB — URINALYSIS, ROUTINE W REFLEX MICROSCOPIC
Bacteria, UA: NONE SEEN
Bilirubin Urine: NEGATIVE
Glucose, UA: NEGATIVE mg/dL
Ketones, ur: NEGATIVE mg/dL
Nitrite: NEGATIVE
Protein, ur: NEGATIVE mg/dL
Specific Gravity, Urine: 1.019 (ref 1.005–1.030)
WBC, UA: >50 WBC/hpf (ref 0–5)
pH: 6 (ref 5.0–8.0)

## 2022-12-10 MED ORDER — STERILE WATER FOR INJECTION IJ SOLN
INTRAMUSCULAR | Status: AC
  Filled 2022-12-10: qty 10

## 2022-12-10 MED ORDER — DOXYCYCLINE HYCLATE 100 MG PO CAPS: 100.0000 mg | ORAL_CAPSULE | Freq: Two times a day (BID) | ORAL | 0 refills | Status: DC

## 2022-12-10 MED ORDER — CEFTRIAXONE SODIUM 1 G IJ SOLR
500.0000 mg | Freq: Once | INTRAMUSCULAR | Status: AC
Start: 2022-12-10 — End: 2022-12-10
  Administered 2022-12-10: 500 mg via INTRAMUSCULAR
  Filled 2022-12-10: qty 10

## 2022-12-10 MED ORDER — DOXYCYCLINE HYCLATE 100 MG PO TABS
100.0000 mg | ORAL_TABLET | Freq: Once | ORAL | Status: AC
  Administered 2022-12-10: 100 mg via ORAL
  Filled 2022-12-10: qty 1

## 2022-12-10 NOTE — Discharge Instructions (Signed)
Take the antibiotics as prescribed.  Follow-up with the health department.  Use a condom for every sexual encounter.  Return to the ED with new or worsening symptoms.

## 2022-12-10 NOTE — ED Provider Notes (Signed)
[place] EMERGENCY DEPARTMENT AT [facility] Provider Note   CSN: [id number] Arrival date & time: 12/09/22  2300     History  Chief Complaint  Patient presents with   Penile Discharge    Daniel Higgins is a 27 y.o. male.  Patient reports exposure to gonorrhea from male sex partner 3 days ago.  States he has discharge from his penis as well as burning with urination and frequency and urgency.  Has noticed white discharge.  Some lower abdominal pain.  No fever, chills, nausea, vomiting, low back pain.  No testicular pain.  No rash.  The history is provided by the patient.  Penile Discharge Pertinent negatives include no chest pain, no abdominal pain, no headaches and no shortness of breath.       Home Medications Prior to Admission medications   Medication Sig Start Date End Date Taking? Authorizing Provider  fluticasone (FLONASE) 50 MCG/ACT nasal spray Place 2 sprays into both nostrils daily. Patient not taking: Reported on 02/10/2019 06/14/18 12/12/19  Loren Racer, MD  loratadine (CLARITIN) 10 MG tablet Take 1 tablet (10 mg total) by mouth daily. Patient not taking: Reported on 02/10/2019 06/14/18 12/12/19  Loren Racer, MD      Allergies    Other    Review of Systems   Review of Systems  Constitutional:  Negative for activity change, appetite change and fever.  HENT:  Negative for congestion.   Respiratory:  Negative for cough, chest tightness and shortness of breath.   Cardiovascular:  Negative for chest pain.  Gastrointestinal:  Negative for abdominal pain, nausea and vomiting.  Genitourinary:  Positive for dysuria, frequency, penile discharge and urgency. Negative for flank pain and testicular pain.  Musculoskeletal:  Negative for arthralgias and myalgias.  Skin:  Negative for rash.  Neurological:  Negative for weakness and headaches.   all other systems are negative except as noted in the HPI and PMH.    Physical Exam Updated Vital  Signs BP 105/68 (BP Location: Left Arm)   Pulse 69   Temp 98.3 F (36.8 C) (Oral)   Resp 16   Ht 5\' 11"  (1.803 m)   Wt 56.7 kg   SpO2 96%   BMI 17.43 kg/m  Physical Exam Vitals and nursing note reviewed.  Constitutional:      General: He is not in acute distress.    Appearance: He is well-developed.  HENT:     Head: Normocephalic and atraumatic.     Mouth/Throat:     Pharynx: No oropharyngeal exudate.  Eyes:     Conjunctiva/sclera: Conjunctivae normal.     Pupils: Pupils are equal, round, and reactive to light.  Neck:     Comments: No meningismus. Cardiovascular:     Rate and Rhythm: Normal rate and regular rhythm.     Heart sounds: Normal heart sounds. No murmur heard. Pulmonary:     Effort: Pulmonary effort is normal. No respiratory distress.     Breath sounds: Normal breath sounds.  Abdominal:     Palpations: Abdomen is soft.     Tenderness: There is no abdominal tenderness. There is no guarding or rebound.  Genitourinary:    Comments: No rash.  No testicular tenderness. Musculoskeletal:        General: No tenderness. Normal range of motion.     Cervical back: Normal range of motion and neck supple.  Skin:    General: Skin is warm.  Neurological:     Mental Status: He is alert  and oriented to person, place, and time.     Cranial Nerves: No cranial nerve deficit.     Motor: No abnormal muscle tone.     Coordination: Coordination normal.     Comments:  5/5 strength throughout. CN 2-12 intact.Equal grip strength.   Psychiatric:        Behavior: Behavior normal.     ED Results / Procedures / Treatments   Labs (all labs ordered are listed, but only abnormal results are displayed) Labs Reviewed  URINALYSIS, ROUTINE W REFLEX MICROSCOPIC - Abnormal; Notable for the following components:      Result Value   Hgb urine dipstick MODERATE (*)    Leukocytes,Ua MODERATE (*)    All other components within normal limits    EKG None  Radiology No results  found.  Procedures Procedures    Medications Ordered in ED Medications  cefTRIAXone (ROCEPHIN) injection 500 mg (has no administration in time range)  doxycycline (VIBRA-TABS) tablet 100 mg (has no administration in time range)    ED Course/ Medical Decision Making/ A&P                                 Medical Decision Making Amount and/or Complexity of Data Reviewed Labs: ordered. Decision-making details documented in ED Course. Radiology: ordered and independent interpretation performed. Decision-making details documented in ED Course. ECG/medicine tests: ordered and independent interpretation performed. Decision-making details documented in ED Course.  Risk Prescription drug management.   Exposure to gonorrhea with discharge and dysuria.  Vital stable, no distress.  Abdomen soft without peritoneal signs.  Will treat empirically with IM Rocephin and p.o. doxycycline. UA is consistent with urethritis.  Discussed safe sex practices including using a condom with every sexual encounter.  Follow-up with the health department.  Return precautions discussed.        Final Clinical Impression(s) / ED Diagnoses Final diagnoses:  Exposure to STD    Rx / DC Orders ED Discharge Orders     None         Kymberly Blomberg, Jeannett Senior, MD 12/10/22 [phone]

## 2023-01-14 ENCOUNTER — Ambulatory Visit (HOSPITAL_COMMUNITY)
Admission: EM | Admit: 2023-01-14 | Discharge: 2023-01-14 | Disposition: A | Discharge status: Home / Self Care | Payer: Self-pay | Attending: Physician Assistant | Admitting: Physician Assistant

## 2023-01-14 ENCOUNTER — Other Ambulatory Visit: Payer: Self-pay

## 2023-01-14 ENCOUNTER — Encounter (HOSPITAL_COMMUNITY): Payer: Self-pay | Admitting: Emergency Medicine

## 2023-01-14 ENCOUNTER — Emergency Department (HOSPITAL_COMMUNITY)
Admission: EM | Admit: 2023-01-14 | Discharge: 2023-01-14 | Disposition: A | Discharge status: Home / Self Care | Payer: Self-pay | Attending: Emergency Medicine | Admitting: Emergency Medicine

## 2023-01-14 DIAGNOSIS — Z711 Person with feared health complaint in whom no diagnosis is made: Secondary | ICD-10-CM

## 2023-01-14 DIAGNOSIS — Z202 Contact with and (suspected) exposure to infections with a predominantly sexual mode of transmission: Secondary | ICD-10-CM | POA: Insufficient documentation

## 2023-01-14 DIAGNOSIS — Z113 Encounter for screening for infections with a predominantly sexual mode of transmission: Secondary | ICD-10-CM | POA: Insufficient documentation

## 2023-01-14 LAB — HIV ANTIBODY (ROUTINE TESTING W REFLEX): HIV Screen 4th Generation wRfx: NONREACTIVE

## 2023-01-14 NOTE — ED Triage Notes (Signed)
Pt requesting STD testing. Denies any s/s or known exposures.

## 2023-01-14 NOTE — Discharge Instructions (Signed)
We does need for gonorrhea, chlamydia, trichomonas, HIV, syphilis.  Monitor your MyChart for these results.  We will contact you if we need to arrange treatment but typically do not call if everything comes back negative.  Abstain from sex until you receive results.  Use a condom please sexual encounter.  As we discussed, we typically do not do blood testing for herpes.  If you develop any kind of blisters, painful rash, ulcers please return immediately so we can do additional testing.

## 2023-01-14 NOTE — ED Provider Notes (Signed)
[facility]    CSN: [id number] Arrival date & time: 01/14/23  1328      History   Chief Complaint Chief Complaint  Patient presents with   SEXUALLY TRANSMITTED DISEASE    HPI Daniel Higgins is a 27 y.o. male.   Patient presents today requesting STI testing.  He is sexually active with male partners but does not always use condoms.  He has had sexual transmitted infections in the past.  Denies any recent antibiotic use.  Denies any history of medication allergies.  He reports that a newer partner tested positive for herpes and so he is interested in being tested for this as well as all other STIs.  He is not experiencing any symptoms and denies any penile discharge, dysuria, genital lesions or rash.  He did go to the emergency room earlier and they encouraged him to follow-up with the health department.    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 Authorizing Provider  fluticasone (FLONASE) 50 MCG/ACT nasal spray Place 2 sprays into both nostrils daily. Patient not taking: Reported on 02/10/2019 06/14/18 12/12/19  Loren Racer, MD  loratadine (CLARITIN) 10 MG tablet Take 1 tablet (10 mg total) by mouth daily. Patient not taking: Reported on 02/10/2019 06/14/18 12/12/19  Loren Racer, MD    Family History Family History  Problem Relation Age of Onset   Healthy Father     Social History Social History   Tobacco Use   Smoking status: Some Days    Types: Cigars   Smokeless tobacco: Never  Vaping Use   Vaping status:  Never Used  Substance Use Topics   Alcohol use: No   Drug use: No     Allergies   Other   Review of Systems Review of Systems  Constitutional:  Negative for activity change, appetite change, fatigue and fever.  Genitourinary:  Negative for dysuria, frequency, genital sores, hematuria, penile discharge and urgency.     Physical Exam Triage Vital Signs ED Triage Vitals  Encounter Vitals Group     BP 01/14/23 1535 (!) 93/59     Systolic BP Percentile --      Diastolic BP Percentile --      Pulse Rate 01/14/23 1535 89     Resp 01/14/23 1535 14     Temp 01/14/23 1535 98.4 F (36.9 C)     Temp Source 01/14/23 1535 Oral     SpO2 01/14/23 1535 98 %     Weight --      Height --      Head Circumference --      Peak Flow --      Pain Score 01/14/23 1534 0     Pain Loc --      Pain Education --      Exclude from Growth Chart --    No data found.  Updated Vital Signs BP 97/61 (BP Location: Left Arm)   Pulse 89   Temp 98.4 F (36.9 C) (Oral)   Resp 14  SpO2 98%   Visual Acuity Right Eye Distance:   Left Eye Distance:   Bilateral Distance:    Right Eye Near:   Left Eye Near:    Bilateral Near:     Physical Exam Vitals reviewed.  Constitutional:      General: He is awake.     Appearance: Normal appearance. He is well-developed. He is not ill-appearing.     Comments: Very pleasant male appears stated age in no acute distress sitting comfortably in exam room  HENT:     Head: Normocephalic and atraumatic.     Mouth/Throat:     Pharynx: No oropharyngeal exudate, posterior oropharyngeal erythema or uvula swelling.  Cardiovascular:     Rate and Rhythm: Normal rate and regular rhythm.     Heart sounds: Normal heart sounds, S1 normal and S2 normal. No murmur heard. Pulmonary:     Effort: Pulmonary effort is normal.     Breath sounds: Normal breath sounds. No stridor. No wheezing, rhonchi or rales.     Comments: Clear to auscultation bilaterally Abdominal:      General: Bowel sounds are normal.     Palpations: Abdomen is soft.     Tenderness: There is no abdominal tenderness.     Comments: Benign abdominal exam  Genitourinary:    Penis: Normal and circumcised. No discharge or lesions.      Testes: Normal.     Comments: Dee, CMA present as chaperone during exam. Neurological:     Mental Status: He is alert.  Psychiatric:        Behavior: Behavior is cooperative.      UC Treatments / Results  Labs (all labs ordered are listed, but only abnormal results are displayed) Labs Reviewed  HIV ANTIBODY (ROUTINE TESTING W REFLEX)  RPR  CYTOLOGY, (ORAL, ANAL, URETHRAL) ANCILLARY ONLY    EKG   Radiology No results found.  Procedures Procedures (including critical care time)  Medications Ordered in UC Medications - No data to display  Initial Impression / Assessment and Plan / UC Course  I have reviewed the triage vital signs and the nursing notes.  Pertinent labs & imaging results that were available during my care of the patient were reviewed by me and considered in my medical decision making (see chart for details).     Patient is well-appearing, afebrile, nontoxic, nontachycardic.  His blood pressure was slightly low but this appears to be similar to his baseline and he denies any symptoms including lightheadedness or weakness.  Recheck improved slightly.  Testing was obtained for gonorrhea, chlamydia, trichomonas as well as blood testing for HIV and syphilis.  Patient requested blood testing for HSV and we discussed that this is typically not indicated.  Exam was performed with no concerning lesions but we did discuss that if he develops any kind of painful rash, vesicles, ulcers he should return so we can culture the specimen for more definitive testing.  He was agreeable.  Discussed that he should abstain from sex until results are available.  If anything changes or worsens he is to return for reevaluation.  All questions were answered  to patient satisfaction.  Final Clinical Impressions(s) / UC Diagnoses   Final diagnoses:  Screening examination for STI     Discharge Instructions      We does need for gonorrhea, chlamydia, trichomonas, HIV, syphilis.  Monitor your MyChart for these results.  We will contact you if we need to arrange treatment but typically do not call if everything  comes back negative.  Abstain from sex until you receive results.  Use a condom please sexual encounter.  As we discussed, we typically do not do blood testing for herpes.  If you develop any kind of blisters, painful rash, ulcers please return immediately so we can do additional testing.     ED Prescriptions   None    PDMP not reviewed this encounter.   Jeani Hawking, PA-C 01/14/23 1639

## 2023-01-14 NOTE — ED Provider Notes (Signed)
[place] EMERGENCY DEPARTMENT AT [facility] Provider Note   CSN: [id number] Arrival date & time: 01/14/23  1151     History  Chief Complaint  Patient presents with   STD Screening    Daniel Higgins is a 27 y.o. male.  The history is provided by the patient and medical records. No language interpreter was used.     27 year old male with history of recurrent STI presenting requesting for STI screening.  Patient states he would like to be screened for STI.  He denies having any symptoms.  He did endorse having a new sexual partner and she was tested positive for herpes in the past.  He denies having any rash any penile discharge or any urinary discomfort.  Home Medications Prior to Admission medications   Medication Sig Start Date End Date Taking? Authorizing Provider  doxycycline (VIBRAMYCIN) 100 MG capsule Take 1 capsule (100 mg total) by mouth 2 (two) times daily. 12/10/22   Rancour, Jeannett Senior, MD  fluticasone (FLONASE) 50 MCG/ACT nasal spray Place 2 sprays into both nostrils daily. Patient not taking: Reported on 02/10/2019 06/14/18 12/12/19  Loren Racer, MD  loratadine (CLARITIN) 10 MG tablet Take 1 tablet (10 mg total) by mouth daily. Patient not taking: Reported on 02/10/2019 06/14/18 12/12/19  Loren Racer, MD      Allergies    Other    Review of Systems   Review of Systems  All other systems reviewed and are negative.   Physical Exam Updated Vital Signs BP 108/69 (BP Location: Left Arm)   Pulse 96   Temp 98.6 F (37 C) (Oral)   Resp 16   Ht 5\' 9"  (1.753 m)   Wt 56.7 kg   SpO2 100%   BMI 18.46 kg/m  Physical Exam Vitals and nursing note reviewed.  Constitutional:      General: He is not in acute distress.    Appearance: He is well-developed.  HENT:     Head: Atraumatic.  Eyes:     Conjunctiva/sclera: Conjunctivae normal.  Genitourinary:    Comments: Genital exam is deferred Musculoskeletal:     Cervical back: Neck supple.   Skin:    Findings: No rash.  Neurological:     Mental Status: He is alert.     ED Results / Procedures / Treatments   Labs (all labs ordered are listed, but only abnormal results are displayed) Labs Reviewed - No data to display  EKG None  Radiology No results found.  Procedures Procedures    Medications Ordered in ED Medications - No data to display  ED Course/ Medical Decision Making/ A&P                                 Medical Decision Making  BP 108/69 (BP Location: Left Arm)   Pulse 96   Temp 98.6 F (37 C) (Oral)   Resp 16   Ht 5\' 9"  (1.753 m)   Wt 56.7 kg   SpO2 100%   BMI 18.46 kg/m   46:99 PM  27 year old male with history of recurrent STI presenting requesting for STI screening.  Patient states he would like to be screened for STI.  He denies having any symptoms.  He did endorse having a new sexual partner and she was tested positive for herpes in the past.  He denies having any rash any penile discharge or any urinary discomfort.  Patient overall well-appearing, genital  exam is deferred.  Patient does not endorse any rash or penile discharge.  I encourage patient to follow-up with the [facility] department for STI screening.  I did offer patient prophylactic antibiotics such as Rocephin and doxycycline but patient declined.  Patient is stable for discharge.        Final Clinical Impression(s) / ED Diagnoses Final diagnoses:  Concern about STD in male without diagnosis    Rx / DC Orders ED Discharge Orders     None         Fayrene Helper, PA-C 01/14/23 1244    Bethann Berkshire, MD 01/15/23 [phone]

## 2023-01-14 NOTE — ED Triage Notes (Signed)
Patient arrives ambulatory by POV requesting to have STD screening. Patient denies any symptoms at this time.

## 2023-01-14 NOTE — Discharge Instructions (Signed)
Please go to the [facility] department and request to be screened for potential sexually transmitted infection.

## 2023-01-15 LAB — RPR: RPR Ser Ql: NONREACTIVE

## 2023-01-17 LAB — CYTOLOGY, (ORAL, ANAL, URETHRAL) ANCILLARY ONLY
Chlamydia: NEGATIVE
Comment: NEGATIVE
Comment: NEGATIVE
Comment: NORMAL
Neisseria Gonorrhea: NEGATIVE
Trichomonas: NEGATIVE

## 2023-01-21 ENCOUNTER — Ambulatory Visit (HOSPITAL_COMMUNITY)
Admission: EM | Admit: 2023-01-21 | Discharge: 2023-01-21 | Disposition: A | Discharge status: Home / Self Care | Payer: Self-pay | Attending: Physician Assistant | Admitting: Physician Assistant

## 2023-01-21 ENCOUNTER — Encounter (HOSPITAL_COMMUNITY): Payer: Self-pay

## 2023-01-21 DIAGNOSIS — Z202 Contact with and (suspected) exposure to infections with a predominantly sexual mode of transmission: Secondary | ICD-10-CM

## 2023-01-21 MED ORDER — LIDOCAINE HCL (PF) 1 % IJ SOLN
INTRAMUSCULAR | Status: AC
  Filled 2023-01-21: qty 2

## 2023-01-21 MED ORDER — CEFTRIAXONE SODIUM 250 MG IJ SOLR
500.0000 mg | Freq: Once | INTRAMUSCULAR | Status: AC
  Administered 2023-01-21: 500 mg via INTRAMUSCULAR

## 2023-01-21 MED ORDER — CEFTRIAXONE SODIUM 500 MG IJ SOLR
INTRAMUSCULAR | Status: AC
  Filled 2023-01-21: qty 500

## 2023-01-21 MED ORDER — DOXYCYCLINE HYCLATE 100 MG PO CAPS: 100.0000 mg | ORAL_CAPSULE | Freq: Two times a day (BID) | ORAL | 0 refills | Status: DC

## 2023-01-21 NOTE — Discharge Instructions (Signed)
Return if any problems.

## 2023-01-21 NOTE — ED Triage Notes (Addendum)
Patient presenting for gonorrhea treatment. States his partner tested positive 2 days ago. No current symptoms.   Patient states around 7 days ago he was swabbed and had blood drawn. States everything came back negative.

## 2023-01-21 NOTE — ED Provider Notes (Signed)
[facility]    CSN: [id number] Arrival date & time: 01/21/23  7829      History   Chief Complaint Chief Complaint  Patient presents with   Exposure to STD    HPI Daniel Higgins is a 27 y.o. male.   Patient request treatment for gonorrhea.  Patient reports his partner just tested positive.  Patient has a past medical history of the same.  Patient denies any fever or chills he denies any abdominal pain.  Had negative RPR and HIV on 1115.  The history is provided by the patient. No language interpreter was used.  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 Authorizing Provider  doxycycline (VIBRAMYCIN) 100 MG capsule Take 1 capsule (100 mg total) by mouth 2 (two) times daily. 01/21/23  Yes Cheron Schaumann K, PA-C  fluticasone (FLONASE) 50 MCG/ACT nasal spray Place 2 sprays into both nostrils daily. Patient not taking: Reported on 02/10/2019 06/14/18 12/12/19  Loren Racer, MD  loratadine (CLARITIN) 10 MG tablet Take 1 tablet (10 mg total) by mouth daily. Patient not taking: Reported on 02/10/2019 06/14/18 12/12/19  Loren Racer, MD    Family History Family History  Problem Relation Age of Onset   Healthy Father     Social History Social History   Tobacco Use   Smoking status: Some Days    Types: Cigars   Smokeless tobacco: Never  Vaping Use   Vaping status: Never Used  Substance Use Topics   Alcohol use: No   Drug use: No     Allergies   Other   Review of Systems Review of Systems   All other systems reviewed and are negative.    Physical Exam Triage Vital Signs ED Triage Vitals [01/21/23 0944]  Encounter Vitals Group     BP 110/70     Systolic BP Percentile      Diastolic BP Percentile      Pulse Rate 86     Resp 16     Temp 98.4 F (36.9 C)     Temp Source Oral     SpO2 96 %     Weight      Height 5\' 9"  (1.753 m)     Head Circumference      Peak Flow      Pain Score 0     Pain Loc      Pain Education      Exclude from Growth Chart    No data found.  Updated Vital Signs BP 110/70 (BP Location: Left Arm)   Pulse 86   Temp 98.4 F (36.9 C) (Oral)   Resp 16   Ht 5\' 9"  (1.753 m)   SpO2 96%   BMI 18.46 kg/m   Visual Acuity Right Eye Distance:   Left Eye Distance:   Bilateral Distance:    Right Eye Near:   Left Eye Near:    Bilateral Near:     Physical Exam Vitals and nursing note reviewed.  Constitutional:      Appearance: He is well-developed.  HENT:     Head: Normocephalic.  Cardiovascular:     Rate and Rhythm: Normal rate.  Pulmonary:     Effort: Pulmonary effort is normal.  Abdominal:     General: There is no distension.  Musculoskeletal:        General: Normal range of motion.     Cervical back: Normal range of motion.  Skin:    General: Skin is warm.  Neurological:     General: No focal deficit present.     Mental Status: He is alert and oriented to person, place, and time.      UC Treatments / Results  Labs (all labs ordered are listed, but only abnormal results are displayed) Labs Reviewed  CYTOLOGY, (ORAL, ANAL, URETHRAL) ANCILLARY ONLY    EKG   Radiology No results found.  Procedures Procedures (including critical care time)  Medications Ordered in UC Medications  cefTRIAXone (ROCEPHIN) injection 500 mg (has no administration in time range)    Initial Impression / Assessment and Plan / UC Course  I have reviewed the triage vital signs and the nursing notes.  Pertinent labs & imaging results  that were available during my care of the patient were reviewed by me and considered in my medical decision making (see chart for details).     Given Rocephin 500 mg IM.  Patient is given a prescription for doxycycline.  Patient is counseled on safe sex. Final Clinical Impressions(s) / UC Diagnoses   Final diagnoses:  STD exposure     Discharge Instructions      Return if any problems.    ED Prescriptions     Medication Sig Dispense Auth. Provider   doxycycline (VIBRAMYCIN) 100 MG capsule Take 1 capsule (100 mg total) by mouth 2 (two) times daily. 20 capsule Elson Areas, [place]      PDMP not reviewed this encounter. An After Visit Summary was printed and given to the patient.       Elson Areas, [place] 01/21/23 1000

## 2023-02-19 ENCOUNTER — Other Ambulatory Visit: Payer: Self-pay

## 2023-02-19 ENCOUNTER — Emergency Department (HOSPITAL_COMMUNITY)
Admission: EM | Admit: 2023-02-19 | Discharge: 2023-02-19 | Disposition: A | Discharge status: Home / Self Care | Payer: Self-pay | Attending: Emergency Medicine | Admitting: Emergency Medicine

## 2023-02-19 ENCOUNTER — Emergency Department (HOSPITAL_COMMUNITY): Payer: Self-pay

## 2023-02-19 ENCOUNTER — Encounter (HOSPITAL_COMMUNITY): Payer: Self-pay

## 2023-02-19 DIAGNOSIS — M25532 Pain in left wrist: Secondary | ICD-10-CM

## 2023-02-19 DIAGNOSIS — M25531 Pain in right wrist: Secondary | ICD-10-CM | POA: Insufficient documentation

## 2023-02-19 DIAGNOSIS — Z5321 Procedure and treatment not carried out due to patient leaving prior to being seen by health care provider: Secondary | ICD-10-CM | POA: Insufficient documentation

## 2023-02-19 NOTE — ED Provider Notes (Signed)
Patient eloped prior to my arrival to the bedside.   This chart was dictated using voice recognition software, Dragon. Despite the best efforts of this provider to proofread and correct errors, errors may still occur which can change documentation meaning.    Paris Lore, PA-C 02/19/23 0224    Shon Baton, MD 02/19/23 [phone]

## 2023-02-19 NOTE — ED Notes (Addendum)
Pt very agitated & consistently asking for doctor because he "has to go" & says ride is going to leave him; alerted nurse
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
## 2023-02-19 NOTE — ED Notes (Signed)
Pt approached NT and EMT-P stating he wanted to speak with a doctor as he "had to go." Provider was notified pt wanted to speak with them. A few minutes later, pt walked up to EMT-P and asked "Can I just go? My ride is about to leave me." EMT-P recommended the pt stay and be seen. Pt stated "I'll just come back tomorrow." Pt informed this meant he would be leaving AMA; pt acknowledged understanding. Pt signed AMA form and exited the building.

## 2023-03-03 ENCOUNTER — Emergency Department (HOSPITAL_COMMUNITY)
Admission: EM | Admit: 2023-03-03 | Discharge: 2023-03-03 | Discharge status: Against Medical Advice | Payer: Self-pay | Attending: Emergency Medicine | Admitting: Emergency Medicine

## 2023-03-03 ENCOUNTER — Emergency Department (HOSPITAL_COMMUNITY): Payer: Self-pay

## 2023-03-03 DIAGNOSIS — R0602 Shortness of breath: Secondary | ICD-10-CM | POA: Insufficient documentation

## 2023-03-03 DIAGNOSIS — Z5321 Procedure and treatment not carried out due to patient leaving prior to being seen by health care provider: Secondary | ICD-10-CM | POA: Insufficient documentation

## 2023-03-03 NOTE — ED Provider Triage Note (Signed)
 Emergency Medicine Provider Triage Evaluation Note  Daniel Higgins , a 28 y.o. male  was evaluated in triage.  Pt complains of spontaneous shortness of breath that started 20 minutes ago.  History of spontaneous pneumothorax.  Patient states he was lifting something when this started.  Review of Systems  Positive:  Negative: See above  Physical Exam  BP 113/65   Pulse (!) 110   Temp 97.9 F (36.6 C)   Resp 19   Ht 5' 9 (1.753 m)   Wt 59 kg   SpO2 99%   BMI 19.20 kg/m  Gen:   Awake, no distress   Resp:  Normal effort  MSK:   Moves extremities without difficulty  Other:  No tracheal deviation.  Medical Decision Making  Medically screening exam initiated at 6:37 PM.  Appropriate orders placed.  Saeed R Kunin was informed that the remainder of the evaluation will be completed by another provider, this initial triage assessment does not replace that evaluation, and the importance of remaining in the ED until their evaluation is complete.     Theotis Peers [place], [place] 03/03/23 [phone]

## 2023-03-03 NOTE — ED Triage Notes (Signed)
 Pt arrives via POV. Pt reports sudden onset of sob about 20 mins ago. Pt concerned that he may have a collapsed long. Hx of spontaneous pneumo. Pt AxOx4.

## 2023-07-05 ENCOUNTER — Emergency Department (HOSPITAL_COMMUNITY)
Admission: EM | Admit: 2023-07-05 | Discharge: 2023-07-05 | Discharge status: Against Medical Advice | Payer: Self-pay | Attending: Emergency Medicine | Admitting: Emergency Medicine

## 2023-07-05 ENCOUNTER — Encounter (HOSPITAL_COMMUNITY): Payer: Self-pay

## 2023-07-05 ENCOUNTER — Other Ambulatory Visit: Payer: Self-pay

## 2023-07-05 ENCOUNTER — Emergency Department (HOSPITAL_COMMUNITY): Payer: Self-pay

## 2023-07-05 DIAGNOSIS — Z5329 Procedure and treatment not carried out because of patient's decision for other reasons: Secondary | ICD-10-CM | POA: Insufficient documentation

## 2023-07-05 DIAGNOSIS — Z5321 Procedure and treatment not carried out due to patient leaving prior to being seen by health care provider: Secondary | ICD-10-CM

## 2023-07-05 DIAGNOSIS — F17290 Nicotine dependence, other tobacco product, uncomplicated: Secondary | ICD-10-CM | POA: Insufficient documentation

## 2023-07-05 DIAGNOSIS — M62830 Muscle spasm of back: Secondary | ICD-10-CM | POA: Insufficient documentation

## 2023-07-05 MED ORDER — KETOROLAC TROMETHAMINE 15 MG/ML IJ SOLN
15.0000 mg | Freq: Once | INTRAMUSCULAR | Status: AC
  Administered 2023-07-05: 15 mg via INTRAMUSCULAR
  Filled 2023-07-05: qty 1
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
## 2023-07-05 NOTE — ED Notes (Signed)
Pt not found in rm

## 2023-07-05 NOTE — ED Triage Notes (Signed)
 Upper/mid back pain that started a week ago. Pt states it hurts when he moves a certain way. "I have a weird smell in my nose and mouth"

## 2023-12-11 ENCOUNTER — Other Ambulatory Visit: Payer: Self-pay

## 2023-12-11 ENCOUNTER — Emergency Department (HOSPITAL_COMMUNITY)
Admission: EM | Admit: 2023-12-11 | Discharge: 2023-12-11 | Disposition: A | Discharge status: Home / Self Care | Payer: Self-pay | Attending: Emergency Medicine | Admitting: Emergency Medicine

## 2023-12-11 ENCOUNTER — Emergency Department (HOSPITAL_COMMUNITY): Payer: Self-pay

## 2023-12-11 DIAGNOSIS — T07XXXA Unspecified multiple injuries, initial encounter: Secondary | ICD-10-CM

## 2023-12-11 DIAGNOSIS — W3400XA Accidental discharge from unspecified firearms or gun, initial encounter: Secondary | ICD-10-CM | POA: Insufficient documentation

## 2023-12-11 DIAGNOSIS — S71131A Puncture wound without foreign body, right thigh, initial encounter: Secondary | ICD-10-CM | POA: Insufficient documentation

## 2023-12-11 DIAGNOSIS — S71132A Puncture wound without foreign body, left thigh, initial encounter: Secondary | ICD-10-CM | POA: Insufficient documentation

## 2023-12-11 LAB — TYPE AND SCREEN
ABO/RH(D): A POS
Antibody Screen: NEGATIVE

## 2023-12-11 LAB — I-STAT CHEM 8, ED
BUN: 4 mg/dL — ABNORMAL LOW (ref 6–20)
Calcium, Ion: 1.11 mmol/L — ABNORMAL LOW (ref 1.15–1.40)
Chloride: 103 mmol/L (ref 98–111)
Creatinine, Ser: 0.8 mg/dL (ref 0.61–1.24)
Glucose, Bld: 125 mg/dL — ABNORMAL HIGH (ref 70–99)
HCT: 42 % (ref 39.0–52.0)
Hemoglobin: 14.3 g/dL (ref 13.0–17.0)
Potassium: 2.8 mmol/L — ABNORMAL LOW (ref 3.5–5.1)
Sodium: 143 mmol/L (ref 135–145)
TCO2: 22 mmol/L (ref 22–32)

## 2023-12-11 LAB — CBC
HCT: 37.3 % — ABNORMAL LOW (ref 39.0–52.0)
Hemoglobin: 12.3 g/dL — ABNORMAL LOW (ref 13.0–17.0)
MCH: 28.8 pg (ref 26.0–34.0)
MCHC: 33 g/dL (ref 30.0–36.0)
MCV: 87.4 fL (ref 80.0–100.0)
Platelets: 254 K/uL (ref 150–400)
RBC: 4.27 MIL/uL (ref 4.22–5.81)
RDW: 14.6 % (ref 11.5–15.5)
WBC: 22.7 K/uL — ABNORMAL HIGH (ref 4.0–10.5)
nRBC: 0 % (ref 0.0–0.2)

## 2023-12-11 LAB — COMPREHENSIVE METABOLIC PANEL WITH GFR
ALT: 19 U/L (ref 0–44)
AST: 32 U/L (ref 15–41)
Albumin: 3.7 g/dL (ref 3.5–5.0)
Alkaline Phosphatase: 32 U/L — ABNORMAL LOW (ref 38–126)
Anion gap: 13 (ref 5–15)
BUN: 5 mg/dL — ABNORMAL LOW (ref 6–20)
CO2: 24 mmol/L (ref 22–32)
Calcium: 8.5 mg/dL — ABNORMAL LOW (ref 8.9–10.3)
Chloride: 100 mmol/L (ref 98–111)
Creatinine, Ser: 0.91 mg/dL (ref 0.61–1.24)
GFR, Estimated: >60 mL/min (ref >60)
Glucose, Bld: 122 mg/dL — ABNORMAL HIGH (ref 70–99)
Potassium: 4.1 mmol/L (ref 3.5–5.1)
Sodium: 137 mmol/L (ref 135–145)
Total Bilirubin: 0.9 mg/dL (ref 0.0–1.2)
Total Protein: 6.5 g/dL (ref 6.5–8.1)

## 2023-12-11 LAB — ETHANOL: Alcohol, Ethyl (B): <15 mg/dL (ref <15)

## 2023-12-11 LAB — PROTIME-INR
INR: 1.2 (ref 0.8–1.2)
Prothrombin Time: 15.7 s — ABNORMAL HIGH (ref 11.4–15.2)

## 2023-12-11 LAB — I-STAT CG4 LACTIC ACID, ED: Lactic Acid, Venous: 7.6 mmol/L (ref 0.5–1.9)

## 2023-12-11 LAB — ABO/RH: ABO/RH(D): A POS

## 2023-12-11 MED ORDER — IBUPROFEN 800 MG PO TABS
800.0000 mg | ORAL_TABLET | Freq: Three times a day (TID) | ORAL | 0 refills | Status: AC | PRN
Start: 2023-12-11 — End: ?

## 2023-12-11 MED ORDER — OXYCODONE-ACETAMINOPHEN 5-325 MG PO TABS: 1.0000 | ORAL_TABLET | Freq: Four times a day (QID) | ORAL | 0 refills | Status: DC | PRN

## 2023-12-11 MED ORDER — CEFAZOLIN SODIUM-DEXTROSE 2-4 GM/100ML-% IV SOLN
2.0000 g | Freq: Once | INTRAVENOUS | Status: AC
  Administered 2023-12-11 (×2): 2 g via INTRAVENOUS
  Filled 2023-12-11: qty 100

## 2023-12-11 MED ORDER — OXYCODONE-ACETAMINOPHEN 5-325 MG PO TABS
1.0000 | ORAL_TABLET | Freq: Once | ORAL | Status: AC
  Administered 2023-12-11 (×2): 1 via ORAL
  Filled 2023-12-11: qty 1

## 2023-12-11 MED ORDER — TETANUS-DIPHTH-ACELL PERTUSSIS 5-2-15.5 LF-MCG/0.5 IM SUSP
0.5000 mL | Freq: Once | INTRAMUSCULAR | Status: AC
  Administered 2023-12-11 (×2): 0.5 mL via INTRAMUSCULAR
  Filled 2023-12-11: qty 0.5

## 2023-12-11 MED ORDER — SODIUM CHLORIDE 0.9 % IV BOLUS
1000.0000 mL | Freq: Once | INTRAVENOUS | Status: AC
  Administered 2023-12-11 (×2): 1000 mL via INTRAVENOUS

## 2023-12-11 MED ORDER — SENNOSIDES-DOCUSATE SODIUM 8.6-50 MG PO TABS: 1.0000 | ORAL_TABLET | Freq: Every evening | ORAL | 0 refills | Status: AC | PRN

## 2023-12-11 MED ORDER — IOHEXOL 350 MG/ML SOLN
75.0000 mL | Freq: Once | INTRAVENOUS | Status: AC | PRN
  Administered 2023-12-11 (×2): 75 mL via INTRAVENOUS

## 2023-12-11 MED ORDER — METHOCARBAMOL 500 MG PO TABS: 500.0000 mg | ORAL_TABLET | Freq: Three times a day (TID) | ORAL | 0 refills | Status: DC | PRN

## 2023-12-11 MED ORDER — DOXYCYCLINE HYCLATE 100 MG PO CAPS: 100.0000 mg | ORAL_CAPSULE | Freq: Two times a day (BID) | ORAL | 0 refills | Status: AC

## 2023-12-11 MED ORDER — FENTANYL CITRATE (PF) 50 MCG/ML IJ SOSY
50.0000 ug | PREFILLED_SYRINGE | Freq: Once | INTRAMUSCULAR | Status: AC
  Administered 2023-12-11 (×2): 50 ug via INTRAVENOUS
  Filled 2023-12-11: qty 1
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
## 2023-12-11 NOTE — ED Notes (Signed)
 PT going to CT for scans RN and TRN at bedside

## 2023-12-11 NOTE — Progress Notes (Signed)
 Orthopedic Tech Progress Note Patient Details:  Daniel Higgins [birth year] [id number]  Patient ID: Daniel Higgins, male   DOB: 13-Mar-1995, 28 y.o.   MRN: [id number] I attended trauma page. Chandra Dorn PARAS 12/11/2023, 12:50 AM
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
## 2023-12-11 NOTE — ED Notes (Signed)
 PT back from CT and talking to [employer] at this time VSS
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
## 2024-02-15 ENCOUNTER — Other Ambulatory Visit: Payer: Self-pay

## 2024-02-15 ENCOUNTER — Emergency Department
Admission: EM | Admit: 2024-02-15 | Discharge: 2024-02-15 | Disposition: A | Discharge status: Home / Self Care | Payer: Self-pay | Attending: Emergency Medicine | Admitting: Emergency Medicine

## 2024-02-15 DIAGNOSIS — Z202 Contact with and (suspected) exposure to infections with a predominantly sexual mode of transmission: Secondary | ICD-10-CM | POA: Insufficient documentation

## 2024-02-15 LAB — CHLAMYDIA/NGC RT PCR (ARMC ONLY)
Chlamydia Tr: NOT DETECTED
N gonorrhoeae: NOT DETECTED

## 2024-02-15 MED ORDER — CEFTRIAXONE SODIUM 1 G IJ SOLR
500.0000 mg | Freq: Once | INTRAMUSCULAR | Status: AC
  Administered 2024-02-15: 20:00:00 500 mg via INTRAMUSCULAR
  Filled 2024-02-15: qty 10

## 2024-02-15 MED ORDER — LIDOCAINE HCL (PF) 1 % IJ SOLN
5.0000 mL | Freq: Once | INTRAMUSCULAR | Status: AC
  Administered 2024-02-15: 20:00:00 2.1 mL
  Filled 2024-02-15: qty 5

## 2024-02-15 MED ORDER — AZITHROMYCIN 500 MG PO TABS
1000.0000 mg | ORAL_TABLET | Freq: Once | ORAL | Status: AC
  Administered 2024-02-15: 20:00:00 1000 mg via ORAL
  Filled 2024-02-15: qty 2

## 2024-02-15 MED ORDER — METRONIDAZOLE 500 MG PO TABS
2000.0000 mg | ORAL_TABLET | Freq: Once | ORAL | Status: AC
  Administered 2024-02-15: 20:00:00 2000 mg via ORAL
  Filled 2024-02-15: qty 4

## 2024-02-15 NOTE — Discharge Instructions (Addendum)
 You have been treated for STD exposure in the ED. You should avoid any sexual contact for at least 1 weeks. Any sexual partner you have been involved with, needs to be treated and symptom-free before you get involved sexually. Follow-up with the [facility] Department for further testing and treatment.

## 2024-02-15 NOTE — ED Provider Notes (Signed)
 [facility] Emergency Department Provider Note     Event Date/Time   First MD Initiated Contact with Patient 02/15/24 1935     (approximate)   History   Exposure to STD   HPI  Daniel Higgins is a 28 y.o. male with a noncontributory medical history, presents to the ED for evaluation but denies urethral discharge.  Patient reports unprotected sex with a partner who was later confirmed to be positive for chlamydia and gonorrhea.  Patient denies any fevers, chills, sweats.  Presents to the ED for evaluation management.   Physical Exam   Triage Vital Signs: ED Triage Vitals  Encounter Vitals Group     BP 02/15/24 1754 106/66     Girls Systolic BP Percentile --      Girls Diastolic BP Percentile --      Boys Systolic BP Percentile --      Boys Diastolic BP Percentile --      Pulse Rate 02/15/24 1754 88     Resp 02/15/24 1754 18     Temp 02/15/24 1754 98.7 F (37.1 C)     Temp Source 02/15/24 1754 Oral     SpO2 02/15/24 1754 98 %     Weight 02/15/24 1801 130 lb (59 kg)     Height 02/15/24 1801 5' 9 (1.753 m)     Head Circumference --      Peak Flow --      Pain Score 02/15/24 1758 0     Pain Loc --      Pain Education --      Exclude from Growth Chart --     Most recent vital signs: Vitals:   02/15/24 1754  BP: 106/66  Pulse: 88  Resp: 18  Temp: 98.7 F (37.1 C)  SpO2: 98%    General Awake, no distress. NAD HEENT NCAT. PERRL. EOMI. No rhinorrhea. Mucous membranes are moist. CV:  Good peripheral perfusion. RRR RESP:  Normal effort. CTA ABD:  No distention.  GU:  deferred   ED Results / Procedures / Treatments   Labs (all labs ordered are listed, but only abnormal results are displayed) Labs Reviewed  CHLAMYDIA/NGC RT PCR [facility] ONLY)               EKG    RADIOLOGY   PROCEDURES:  Critical Care performed: No  Procedures   MEDICATIONS ORDERED IN ED: Medications  azithromycin  (ZITHROMAX ) tablet 1,000 mg (1,000 mg  Oral Given 02/15/24 2011)  cefTRIAXone  (ROCEPHIN ) injection 500 mg (500 mg Intramuscular Given 02/15/24 2016)  lidocaine  (PF) (XYLOCAINE ) 1 % injection 5 mL (2.1 mLs Infiltration Given 02/15/24 2014)  metroNIDAZOLE  (FLAGYL ) tablet 2,000 mg (2,000 mg Oral Given 02/15/24 2010)     IMPRESSION / MDM / ASSESSMENT AND PLAN / ED COURSE  I reviewed the triage vital signs and the nursing notes.                              Differential diagnosis includes, but is not limited to, gonorrhea, chlamydia, NGU  Patient's presentation is most consistent with acute complicated illness / injury requiring diagnostic workup.  Patient's diagnosis is consistent with STD exposure.  Patient treated empirically for gonorrhea and chlamydia.  Patient will be discharged home with instruction to avoid intimate contact for the next 7 days. Patient is to follow up with the [facility] department for further testing and management as discussed, as  needed or otherwise directed. Patient is given ED precautions to return to the ED for any worsening or new symptoms.   FINAL CLINICAL IMPRESSION(S) / ED DIAGNOSES   Final diagnoses:  STD exposure     Rx / DC Orders   ED Discharge Orders     None        Note:  This document was prepared using Dragon voice recognition software and may include unintentional dictation errors.    Loyd Candida LULLA Aldona, PA-C 02/15/24 2034    Floy Roberts, MD 02/15/24 2139

## 2024-02-15 NOTE — ED Triage Notes (Signed)
 Pt to ED via POV. Pt states he had unprotected sex and partner was positive for chlamydia.
